# Patient Record
Sex: Female | Born: 1970 | Race: White | Hispanic: No | Marital: Married | Smoking: Former smoker
Health system: Southern US, Community
[De-identification: ages and names within clinical notes are randomized; demographics above are authoritative.]

## PROBLEM LIST (undated history)

## (undated) ENCOUNTER — Emergency Department (HOSPITAL_COMMUNITY): Admission: EM | Payer: Self-pay | Source: Home / Self Care

## (undated) DIAGNOSIS — F988 Other specified behavioral and emotional disorders with onset usually occurring in childhood and adolescence: Secondary | ICD-10-CM

## (undated) HISTORY — PX: ANKLE SURGERY: SHX546

---

## 2010-09-11 ENCOUNTER — Emergency Department (HOSPITAL_COMMUNITY)
Admission: EM | Admit: 2010-09-11 | Discharge: 2010-09-12 | Disposition: A | Discharge status: Home / Self Care | Payer: Self-pay | Attending: Emergency Medicine | Admitting: Emergency Medicine

## 2010-09-11 DIAGNOSIS — M79609 Pain in unspecified limb: Secondary | ICD-10-CM | POA: Insufficient documentation

## 2010-12-19 ENCOUNTER — Emergency Department (HOSPITAL_COMMUNITY)
Admission: EM | Admit: 2010-12-19 | Discharge: 2010-12-19 | Disposition: A | Discharge status: Home / Self Care | Payer: BC Managed Care – PPO | Attending: Emergency Medicine | Admitting: Emergency Medicine

## 2010-12-19 DIAGNOSIS — L259 Unspecified contact dermatitis, unspecified cause: Secondary | ICD-10-CM | POA: Insufficient documentation

## 2010-12-19 DIAGNOSIS — L299 Pruritus, unspecified: Secondary | ICD-10-CM | POA: Insufficient documentation

## 2010-12-19 DIAGNOSIS — M79609 Pain in unspecified limb: Secondary | ICD-10-CM | POA: Insufficient documentation

## 2011-06-06 ENCOUNTER — Encounter: Payer: Self-pay | Admitting: Emergency Medicine

## 2011-06-06 ENCOUNTER — Emergency Department (HOSPITAL_COMMUNITY)
Admission: EM | Admit: 2011-06-06 | Discharge: 2011-06-06 | Disposition: A | Discharge status: Home / Self Care | Payer: BC Managed Care – PPO | Source: Home / Self Care | Attending: Family Medicine | Admitting: Family Medicine

## 2011-06-06 DIAGNOSIS — Z888 Allergy status to other drugs, medicaments and biological substances status: Secondary | ICD-10-CM

## 2011-06-06 DIAGNOSIS — T7840XA Allergy, unspecified, initial encounter: Secondary | ICD-10-CM

## 2011-06-06 NOTE — ED Notes (Signed)
Patient reports being on cipro the last two weeks for sinus infection.  Thursday physician stopped cipro and started current medicine.  Medicine changed by physician because he felt this medicine would work better

## 2011-06-06 NOTE — ED Provider Notes (Signed)
History     CSN: [id number] Arrival date & time: 06/06/2011  1:28 PM   First MD Initiated Contact with Patient 06/06/11 1250      Chief Complaint  Patient presents with  . Sore    seen at the [facility] on wednesday, physician did drain wjound on back of neck.  reports not as much pressure, but still hurts. pat also feels she is having reaction to medicine.  in general she is itching.  onset yesterday am.      (Consider location/radiation/quality/duration/timing/severity/associated sxs/prior treatment) Patient is a 40 y.o. female presenting with abscess.  Abscess  This is a new problem. The current episode started less than one week ago. The onset was sudden. The problem has been resolved (seen by lmd on thurs and had i+d , put on bactrim, now with rash., itching.). The abscess is present on the neck. The problem is mild.    No past medical history on file.  No past surgical history on file.  No family history on file.  History  Substance Use Topics  . Smoking status: Never Smoker   . Smokeless tobacco: Not on file  . Alcohol Use: No    OB History    Grav Para Term Preterm Abortions TAB SAB Ect Mult Living                  Review of Systems  Constitutional: Negative.   HENT: Negative.   Respiratory: Negative.   Skin: Positive for rash and wound.    Allergies  Lamisil  Home Medications   Current Outpatient Rx  Name Route Sig Dispense Refill  . ESCITALOPRAM OXALATE 20 MG PO TABS Oral Take 20 mg by mouth daily.      Marland Kitchen HYDROCODONE-ACETAMINOPHEN 5-325 MG PO TABS Oral Take 1 tablet by mouth every 6 (six) hours as needed.      . TERBINAFINE HCL 250 MG PO TABS Oral Take 250 mg by mouth daily.       BP 106/65  Pulse 65  Temp(Src) 98 F (36.7 C) (Oral)  Resp 20  SpO2 97%  LMP 06/03/2011  Physical Exam  Nursing note and vitals reviewed. Constitutional: She appears well-developed and well-nourished.  HENT:  Head: Normocephalic.  Mouth/Throat:  Oropharynx is clear and moist.  Neck: Normal range of motion. Neck supple.  Pulmonary/Chest: Effort normal and breath sounds normal.  Skin: Skin is warm and dry. Rash noted. There is erythema.       ED Course  Procedures (including critical care time)  Labs Reviewed - No data to display No results found.   1. Allergic reaction to drug       MDM          Barkley Bruns, MD 06/06/11 1410

## 2011-06-20 ENCOUNTER — Emergency Department (HOSPITAL_COMMUNITY)
Admission: EM | Admit: 2011-06-20 | Discharge: 2011-06-21 | Disposition: A | Discharge status: Home / Self Care | Payer: BC Managed Care – PPO | Attending: Emergency Medicine | Admitting: Emergency Medicine

## 2011-06-20 ENCOUNTER — Encounter (HOSPITAL_COMMUNITY): Payer: Self-pay

## 2011-06-20 ENCOUNTER — Other Ambulatory Visit: Payer: Self-pay

## 2011-06-20 ENCOUNTER — Emergency Department (HOSPITAL_COMMUNITY): Payer: BC Managed Care – PPO

## 2011-06-20 DIAGNOSIS — R071 Chest pain on breathing: Secondary | ICD-10-CM | POA: Insufficient documentation

## 2011-06-20 DIAGNOSIS — Z87891 Personal history of nicotine dependence: Secondary | ICD-10-CM | POA: Insufficient documentation

## 2011-06-20 DIAGNOSIS — R0789 Other chest pain: Secondary | ICD-10-CM

## 2011-06-20 LAB — BASIC METABOLIC PANEL
CO2: 25 mEq/L (ref 19–32)
Calcium: 9.3 mg/dL (ref 8.4–10.5)
Chloride: 107 mEq/L (ref 96–112)
Potassium: 3.8 mEq/L (ref 3.5–5.1)
Sodium: 140 mEq/L (ref 135–145)

## 2011-06-20 LAB — CBC
MCH: 30.2 pg (ref 26.0–34.0)
Platelets: 265 10*3/uL (ref 150–400)
RBC: 3.98 MIL/uL (ref 3.87–5.11)
WBC: 12.2 10*3/uL — ABNORMAL HIGH (ref 4.0–10.5)

## 2011-06-20 LAB — POCT I-STAT TROPONIN I: Troponin i, poc: 0 ng/mL (ref 0.00–0.08)

## 2011-06-20 MED ORDER — KETOROLAC TROMETHAMINE 30 MG/ML IJ SOLN
30.0000 mg | Freq: Once | INTRAMUSCULAR | Status: AC
  Administered 2011-06-21: 30 mg via INTRAVENOUS
  Filled 2011-06-20: qty 1

## 2011-06-20 MED ORDER — ASPIRIN 81 MG PO CHEW
324.0000 mg | CHEWABLE_TABLET | Freq: Once | ORAL | Status: AC
  Administered 2011-06-21: 324 mg via ORAL
  Filled 2011-06-20: qty 4

## 2011-06-20 NOTE — ED Provider Notes (Addendum)
History     CSN: [id number]  Arrival date & time 06/20/11  2129   First MD Initiated Contact with Patient 06/20/11 2331      Chief Complaint  Patient presents with  . Chest Pain    (Consider location/radiation/quality/duration/timing/severity/associated sxs/prior treatment) Patient is a 40 y.o. female presenting with chest pain. The history is provided by the patient.  Chest Pain    pain has been present for 4 days and is constant. HEENT is both dull and tight. It is moderately severe and she rates the pain at 8/10. Nothing makes the pain better nothing makes it worse. It is not affected by eating, deep breathing, position, or exertion. She took aspirin 81 mg with no relief. She has not tried to do anything else for it. There is associated dyspnea which is mild. She has chronic nausea and this is unchanged. She denies diaphoresis. She denies cough or fever. She is a former 2 pack-a-day smoker having quit 2 months ago. She has no other significant cardiac risk factors.  History reviewed. No pertinent past medical history.  Past Surgical History  Procedure Date  . Cesarean section     No family history on file.  History  Substance Use Topics  . Smoking status: Former Games developer  . Smokeless tobacco: Not on file  . Alcohol Use: No    OB History    Grav Para Term Preterm Abortions TAB SAB Ect Mult Living                  Review of Systems  Cardiovascular: Positive for chest pain.  All other systems reviewed and are negative.    Allergies  Bactrim ds and Lamisil  Home Medications   Current Outpatient Rx  Name Route Sig Dispense Refill  . ESCITALOPRAM OXALATE 20 MG PO TABS Oral Take 20 mg by mouth daily.      Marland Kitchen HYDROCODONE-ACETAMINOPHEN 5-325 MG PO TABS Oral Take 1 tablet by mouth every 6 (six) hours as needed.     . TERBINAFINE HCL 250 MG PO TABS Oral Take 250 mg by mouth daily.       BP 124/76  Pulse 61  Temp(Src) 97.2 F (36.2 C) (Oral)  Resp 15  Ht 5\' 2"   (1.575 m)  Wt 170 lb (77.111 kg)  BMI 31.09 kg/m2  SpO2 96%  LMP 06/03/2011  Physical Exam  Nursing note and vitals reviewed.  40 year old female who is and in no acute distress. Vital signs are normal. Oxygen saturation is 97% which is normal. Head is normocephalic and atraumatic. PERRLA, EOMI. Oropharynx is clear. Neck is supple without adenopathy or JVD. Back is nontender. Lungs have faint bibasilar rales, no wheezes or rhonchi. Has regular rate and rhythm without murmur. There is mild bilateral parasternal tenderness which reproduces her pain. Abdomen is soft, flat, nontender without masses or hepatosplenomegaly. Extremities have no cyanosis or edema, full range of motion present. Neurologic: Mental status is normal, and cranial nerves are intact, there no focal motor or sensory deficits. Psychiatric: No abnormalities of mood or affect.  ED Course  Procedures (including critical care time)  Labs Reviewed  CBC - Abnormal; Notable for the following:    WBC 12.2 (*)    All other components within normal limits  BASIC METABOLIC PANEL  POCT I-STAT TROPONIN I  I-STAT TROPONIN I   Dg Chest 2 View  06/20/2011  *RADIOLOGY REPORT*  Clinical Data: Chest pain and tightness  CHEST - 2 VIEW  Comparison: None.  Findings: The lungs are clear without focal infiltrate, edema, pneumothorax or pleural effusion. The cardiopericardial silhouette is within normal limits for size. Imaged bony structures of the thorax are intact.  IMPRESSION: No acute findings.  Original Report Authenticated By: ERIC A. MANSELL, M.D.   Results for orders placed during the hospital encounter of 06/20/11  CBC      Component Value Range   WBC 12.2 (*) 4.0 - 10.5 (K/uL)   RBC 3.98  3.87 - 5.11 (MIL/uL)   Hemoglobin 12.0  12.0 - 15.0 (g/dL)   HCT 95.6  21.3 - 08.6 (%)   MCV 91.0  78.0 - 100.0 (fL)   MCH 30.2  26.0 - 34.0 (pg)   MCHC 33.1  30.0 - 36.0 (g/dL)   RDW 57.8  46.9 - 62.9 (%)   Platelets 265  150 - 400 (K/uL)    BASIC METABOLIC PANEL      Component Value Range   Sodium 140  135 - 145 (mEq/L)   Potassium 3.8  3.5 - 5.1 (mEq/L)   Chloride 107  96 - 112 (mEq/L)   CO2 25  19 - 32 (mEq/L)   Glucose, Bld 89  70 - 99 (mg/dL)   BUN 8  6 - 23 (mg/dL)   Creatinine, Ser 5.28  0.50 - 1.10 (mg/dL)   Calcium 9.3  8.4 - 41.3 (mg/dL)   GFR calc non Af Amer >90  >90 (mL/min)   GFR calc Af Amer >90  >90 (mL/min)  POCT I-STAT TROPONIN I      Component Value Range   Troponin i, poc 0.00  0.00 - 0.08 (ng/mL)   Comment 3           D-DIMER, QUANTITATIVE      Component Value Range   D-Dimer, Quant 0.23  0.00 - 0.48 (ug/mL-FEU)   Dg Chest 2 View  06/20/2011  *RADIOLOGY REPORT*  Clinical Data: Chest pain and tightness  CHEST - 2 VIEW  Comparison: None.  Findings: The lungs are clear without focal infiltrate, edema, pneumothorax or pleural effusion. The cardiopericardial silhouette is within normal limits for size. Imaged bony structures of the thorax are intact.  IMPRESSION: No acute findings.  Original Report Authenticated By: ERIC A. MANSELL, M.D.      No diagnosis found.  ECG shows normal sinus rhythm with a rate of 88, no ectopy. Normal axis. Normal P wave. Normal QRS. Normal intervals. Normal ST and T waves. Impression: normal ECG. No prior ECG available for comparison.  She was given aspirin and Toradol in pain is much improved.  ED workup is negative. She'll be treated as chest wall pain and sent home with a prescription for NSAIDs. MDM  Chest pain-etiology unclear. Constant pain for 4 days makes cardiac etiology unlikely, especially in light of the normal ECG. Chest wall tenderness makes musculoskeletal pain most likely.        Dione Booze, MD 06/20/11 2346  Dione Booze, MD 06/21/11 [phone]

## 2011-06-20 NOTE — ED Notes (Signed)
Pt c/o CP x3-4 days that is constant. Nothing makes the pain better or worse

## 2011-06-20 NOTE — ED Notes (Signed)
Pt states that she has been having Cp for the past three days. Pt states that she has been tired more recently and she has been taking naps which is unusual. Pt states that the pain is a dull achy feeling that is constant. Pt states that the pain goes through to her back. Pt states that when she arrived her BP was elevated which is unusual for her as well. Pt alert and oriented and able to follow commands and move extremities.

## 2011-06-21 MED ORDER — NAPROXEN 500 MG PO TABS: 500.0000 mg | ORAL_TABLET | Freq: Two times a day (BID) | ORAL | Status: AC

## 2014-12-29 ENCOUNTER — Encounter (HOSPITAL_BASED_OUTPATIENT_CLINIC_OR_DEPARTMENT_OTHER): Payer: Self-pay | Admitting: *Deleted

## 2014-12-29 ENCOUNTER — Emergency Department (HOSPITAL_BASED_OUTPATIENT_CLINIC_OR_DEPARTMENT_OTHER): Payer: BLUE CROSS/BLUE SHIELD

## 2014-12-29 ENCOUNTER — Emergency Department (HOSPITAL_BASED_OUTPATIENT_CLINIC_OR_DEPARTMENT_OTHER)
Admission: EM | Admit: 2014-12-29 | Discharge: 2014-12-30 | Disposition: A | Discharge status: Home / Self Care | Payer: BLUE CROSS/BLUE SHIELD | Attending: Emergency Medicine | Admitting: Emergency Medicine

## 2014-12-29 DIAGNOSIS — Z79899 Other long term (current) drug therapy: Secondary | ICD-10-CM | POA: Insufficient documentation

## 2014-12-29 DIAGNOSIS — R1011 Right upper quadrant pain: Secondary | ICD-10-CM | POA: Diagnosis present

## 2014-12-29 DIAGNOSIS — Z87891 Personal history of nicotine dependence: Secondary | ICD-10-CM | POA: Diagnosis not present

## 2014-12-29 DIAGNOSIS — K5900 Constipation, unspecified: Secondary | ICD-10-CM | POA: Diagnosis not present

## 2014-12-29 DIAGNOSIS — Z9889 Other specified postprocedural states: Secondary | ICD-10-CM | POA: Diagnosis not present

## 2014-12-29 DIAGNOSIS — Z3202 Encounter for pregnancy test, result negative: Secondary | ICD-10-CM | POA: Diagnosis not present

## 2014-12-29 DIAGNOSIS — R52 Pain, unspecified: Secondary | ICD-10-CM

## 2014-12-29 DIAGNOSIS — N390 Urinary tract infection, site not specified: Secondary | ICD-10-CM | POA: Insufficient documentation

## 2014-12-29 LAB — URINALYSIS, ROUTINE W REFLEX MICROSCOPIC
BILIRUBIN URINE: NEGATIVE
GLUCOSE, UA: NEGATIVE mg/dL
KETONES UR: NEGATIVE mg/dL
Leukocytes, UA: NEGATIVE
NITRITE: NEGATIVE
Protein, ur: NEGATIVE mg/dL
SPECIFIC GRAVITY, URINE: 1.012 (ref 1.005–1.030)
Urobilinogen, UA: 0.2 mg/dL (ref 0.0–1.0)
pH: 6 (ref 5.0–8.0)

## 2014-12-29 LAB — COMPREHENSIVE METABOLIC PANEL
ALT: 18 U/L (ref 14–54)
AST: 17 U/L (ref 15–41)
Albumin: 4 g/dL (ref 3.5–5.0)
Alkaline Phosphatase: 71 U/L (ref 38–126)
Anion gap: 11 (ref 5–15)
BILIRUBIN TOTAL: 0.5 mg/dL (ref 0.3–1.2)
BUN: 9 mg/dL (ref 6–20)
CHLORIDE: 99 mmol/L — AB (ref 101–111)
CO2: 25 mmol/L (ref 22–32)
Calcium: 9.1 mg/dL (ref 8.9–10.3)
Creatinine, Ser: 0.73 mg/dL (ref 0.44–1.00)
GLUCOSE: 107 mg/dL — AB (ref 65–99)
POTASSIUM: 3.7 mmol/L (ref 3.5–5.1)
Sodium: 135 mmol/L (ref 135–145)
TOTAL PROTEIN: 7.4 g/dL (ref 6.5–8.1)

## 2014-12-29 LAB — URINE MICROSCOPIC-ADD ON

## 2014-12-29 LAB — CBC WITH DIFFERENTIAL/PLATELET
BASOS ABS: 0.1 10*3/uL (ref 0.0–0.1)
BASOS PCT: 0 % (ref 0–1)
EOS PCT: 3 % (ref 0–5)
Eosinophils Absolute: 0.5 10*3/uL (ref 0.0–0.7)
HEMATOCRIT: 39.4 % (ref 36.0–46.0)
Hemoglobin: 13.3 g/dL (ref 12.0–15.0)
LYMPHS ABS: 3.5 10*3/uL (ref 0.7–4.0)
Lymphocytes Relative: 27 % (ref 12–46)
MCH: 30.3 pg (ref 26.0–34.0)
MCHC: 33.8 g/dL (ref 30.0–36.0)
MCV: 89.7 fL (ref 78.0–100.0)
Monocytes Absolute: 1.1 10*3/uL — ABNORMAL HIGH (ref 0.1–1.0)
Monocytes Relative: 9 % (ref 3–12)
Neutro Abs: 8 10*3/uL — ABNORMAL HIGH (ref 1.7–7.7)
Neutrophils Relative %: 61 % (ref 43–77)
Platelets: 274 10*3/uL (ref 150–400)
RBC: 4.39 MIL/uL (ref 3.87–5.11)
RDW: 13.7 % (ref 11.5–15.5)
WBC: 13.1 10*3/uL — ABNORMAL HIGH (ref 4.0–10.5)

## 2014-12-29 LAB — PREGNANCY, URINE: PREG TEST UR: NEGATIVE

## 2014-12-29 MED ORDER — DICYCLOMINE HCL 10 MG/ML IM SOLN
20.0000 mg | Freq: Once | INTRAMUSCULAR | Status: AC
  Administered 2014-12-29: 20 mg via INTRAMUSCULAR
  Filled 2014-12-29: qty 2

## 2014-12-29 MED ORDER — GI COCKTAIL ~~LOC~~
30.0000 mL | Freq: Once | ORAL | Status: AC
  Administered 2014-12-29: 30 mL via ORAL
  Filled 2014-12-29: qty 30

## 2014-12-29 MED ORDER — KETOROLAC TROMETHAMINE 30 MG/ML IJ SOLN
30.0000 mg | Freq: Once | INTRAMUSCULAR | Status: AC
  Administered 2014-12-29: 30 mg via INTRAVENOUS
  Filled 2014-12-29: qty 1

## 2014-12-29 NOTE — ED Notes (Signed)
Pt states URQ pain off and on for past few months, was seen at [facility] about a month ago and sent home with no radiology studies done.   Discharged to home.  Pt states today the pain is much worse and is constant.

## 2014-12-29 NOTE — ED Provider Notes (Signed)
CSN: [id number]     Arrival date & time 12/29/14  2204 History   This chart was scribed for Jyquan Kenley, MD by Octavia Heir, ED Scribe. This patient was seen in room MH08/MH08 and the patient's care was started at 11:06 PM.    Chief Complaint  Patient presents with  . Abdominal Pain     Patient is a 44 y.o. female presenting with abdominal pain. The history is provided by the patient. No language interpreter was used.  Abdominal Pain Pain location:  RUQ and RLQ Pain quality: stabbing   Pain radiates to:  Does not radiate Pain severity:  Severe Onset quality:  Gradual Timing:  Intermittent Progression:  Unchanged Chronicity:  New Context: not alcohol use   Relieved by:  Nothing Worsened by:  Nothing tried Ineffective treatments:  None tried Associated symptoms: no dysuria   Risk factors: no alcohol abuse    HPI Comments: Mary Silva is a 44 y.o. female who presents to the Emergency Department complaining of intermittent, gradual worsening upper right quadrant pain onset about one month ago. Pt notes it has been constant for the past 4 hours. Pt was seen at [facility] and was given medication, but pain was consistent. She notes it is a sharp pain that worsens when bends over and then stands back up. She notes her bowel movements are normal. Pt denies burning while urination and urinary frequency.  History reviewed. No pertinent past medical history. Past Surgical History  Procedure Laterality Date  . Cesarean section     No family history on file. History  Substance Use Topics  . Smoking status: Former Games developer  . Smokeless tobacco: Not on file  . Alcohol Use: No   OB History    No data available     Review of Systems  Gastrointestinal: Positive for abdominal pain.  Genitourinary: Negative for dysuria and urgency.  All other systems reviewed and are negative.   A complete 10 system review of systems was obtained and all systems are negative except as noted in the HPI and  PMH.    Allergies  Bactrim ds and Terbinafine hcl  Home Medications   Prior to Admission medications   Medication Sig Start Date End Date Taking? Authorizing Provider  varenicline (CHANTIX PAK) 0.5 MG X 11 & 1 MG X 42 tablet Take by mouth 2 (two) times daily. Take one 0.5 mg tablet by mouth once daily for 3 days, then increase to one 0.5 mg tablet twice daily for 4 days, then increase to one 1 mg tablet twice daily.   Yes Historical Provider, MD  escitalopram (LEXAPRO) 20 MG tablet Take 20 mg by mouth daily.      Historical Provider, MD  HYDROcodone-acetaminophen (NORCO) 5-325 MG per tablet Take 1 tablet by mouth every 6 (six) hours as needed.     Historical Provider, MD  terbinafine (LAMISIL) 250 MG tablet Take 250 mg by mouth daily.     Historical Provider, MD   Triage vitals: BP 124/92 mmHg  Pulse 82  Temp(Src) 98.3 F (36.8 C) (Oral)  Resp 18  Ht 5\' 2"  (1.575 m)  Wt 175 lb (79.379 kg)  BMI 32.00 kg/m2  SpO2 96%  LMP 12/10/2014 Physical Exam  Constitutional: She is oriented to person, place, and time. She appears well-developed and well-nourished. No distress.  HENT:  Head: Normocephalic.  Mouth/Throat: Oropharynx is clear and moist. No oropharyngeal exudate.  Eyes: Conjunctivae are normal. Pupils are equal, round, and reactive to light. No scleral  icterus.  Neck: Normal range of motion. Neck supple. No thyromegaly present.  Cardiovascular: Normal rate and regular rhythm.  Exam reveals no gallop and no friction rub.   No murmur heard. Pulmonary/Chest: Effort normal and breath sounds normal. No respiratory distress. She has no wheezes. She has no rales.  Abdominal: Soft. She exhibits no distension and no mass. Bowel sounds are increased. There is no tenderness. There is no rebound, no guarding, no tenderness at McBurney's point and negative Murphy's sign.  Hyperactive bowel sound throughout Minor constipation  Musculoskeletal: Normal range of motion.  Neurological: She is  alert and oriented to person, place, and time.  Skin: Skin is warm and dry. No rash noted.  Psychiatric: She has a normal mood and affect. Her behavior is normal.  Nursing note and vitals reviewed.   ED Course  Procedures  DIAGNOSTIC STUDIES: Oxygen Saturation is 96% on RA, normal by my interpretation.  COORDINATION OF CARE:  11:09 PM Discussed treatment plan which includes urinalysis with pt at bedside and pt agreed to plan.  Labs Review Labs Reviewed  CBC WITH DIFFERENTIAL/PLATELET  COMPREHENSIVE METABOLIC PANEL    Imaging Review No results found.   EKG Interpretation None      MDM   Final diagnoses:  None    Medications  nitrofurantoin (macrocrystal-monohydrate) (MACROBID) capsule 100 mg (not administered)  ketorolac (TORADOL) 30 MG/ML injection 30 mg (30 mg Intravenous Given 12/29/14 2325)  dicyclomine (BENTYL) injection 20 mg (20 mg Intramuscular Given 12/29/14 2325)  gi cocktail (Maalox,Lidocaine,Donnatal) (30 mLs Oral Given 12/29/14 2325)   Results for orders placed or performed during the hospital encounter of 12/29/14  CBC with Differential  Result Value Ref Range   WBC 13.1 (H) 4.0 - 10.5 K/uL   RBC 4.39 3.87 - 5.11 MIL/uL   Hemoglobin 13.3 12.0 - 15.0 g/dL   HCT 16.139.4 09.636.0 - 04.546.0 %   MCV 89.7 78.0 - 100.0 fL   MCH 30.3 26.0 - 34.0 pg   MCHC 33.8 30.0 - 36.0 g/dL   RDW 40.913.7 81.111.5 - 91.415.5 %   Platelets 274 150 - 400 K/uL   Neutrophils Relative % 61 43 - 77 %   Neutro Abs 8.0 (H) 1.7 - 7.7 K/uL   Lymphocytes Relative 27 12 - 46 %   Lymphs Abs 3.5 0.7 - 4.0 K/uL   Monocytes Relative 9 3 - 12 %   Monocytes Absolute 1.1 (H) 0.1 - 1.0 K/uL   Eosinophils Relative 3 0 - 5 %   Eosinophils Absolute 0.5 0.0 - 0.7 K/uL   Basophils Relative 0 0 - 1 %   Basophils Absolute 0.1 0.0 - 0.1 K/uL  Comprehensive metabolic panel  Result Value Ref Range   Sodium 135 135 - 145 mmol/L   Potassium 3.7 3.5 - 5.1 mmol/L   Chloride 99 (L) 101 - 111 mmol/L   CO2 25 22 - 32  mmol/L   Glucose, Bld 107 (H) 65 - 99 mg/dL   BUN 9 6 - 20 mg/dL   Creatinine, Ser 7.820.73 0.44 - 1.00 mg/dL   Calcium 9.1 8.9 - 95.610.3 mg/dL   Total Protein 7.4 6.5 - 8.1 g/dL   Albumin 4.0 3.5 - 5.0 g/dL   AST 17 15 - 41 U/L   ALT 18 14 - 54 U/L   Alkaline Phosphatase 71 38 - 126 U/L   Total Bilirubin 0.5 0.3 - 1.2 mg/dL   GFR calc non Af Amer >60 >60 mL/min   GFR calc Af  Amer >60 >60 mL/min   Anion gap 11 5 - 15  Urinalysis, Routine w reflex microscopic (not at [facility])  Result Value Ref Range   Color, Urine YELLOW YELLOW   APPearance CLOUDY (A) CLEAR   Specific Gravity, Urine 1.012 1.005 - 1.030   pH 6.0 5.0 - 8.0   Glucose, UA NEGATIVE NEGATIVE mg/dL   Hgb urine dipstick TRACE (A) NEGATIVE   Bilirubin Urine NEGATIVE NEGATIVE   Ketones, ur NEGATIVE NEGATIVE mg/dL   Protein, ur NEGATIVE NEGATIVE mg/dL   Urobilinogen, UA 0.2 0.0 - 1.0 mg/dL   Nitrite NEGATIVE NEGATIVE   Leukocytes, UA NEGATIVE NEGATIVE  Pregnancy, urine  Result Value Ref Range   Preg Test, Ur NEGATIVE NEGATIVE  Urine microscopic-add on  Result Value Ref Range   Squamous Epithelial / LPF FEW (A) RARE   WBC, UA 0-2 <3 WBC/hpf   RBC / HPF 3-6 <3 RBC/hpf   Bacteria, UA MANY (A) RARE   Ct Renal Stone Study  12/30/2014   CLINICAL DATA:  Intermittent worsening right upper quadrant abdominal pain over the past month. Hematuria. Initial encounter.  EXAM: CT ABDOMEN AND PELVIS WITHOUT CONTRAST  TECHNIQUE: Multidetector CT imaging of the abdomen and pelvis was performed following the standard protocol without IV contrast.  COMPARISON:  CT of the abdomen and pelvis from 08/21/2014  FINDINGS: The visualized lung bases are clear.  There is diffuse fatty infiltration within the liver, with mild sparing about the gallbladder fossa. The gallbladder is within normal limits. The pancreas and adrenal glands are unremarkable.  The kidneys are unremarkable in appearance. There is no evidence of hydronephrosis. No renal or ureteral stones  are seen. No perinephric stranding is appreciated.  No free fluid is identified. The small bowel is unremarkable in appearance. The stomach is within normal limits. No acute vascular abnormalities are seen. Minimal calcification is noted at the aortic bifurcation.  The appendix is normal in caliber, without evidence for appendicitis. The colon is unremarkable in appearance.  The bladder is decompressed and not well assessed. The uterus is grossly unremarkable. The ovaries are grossly symmetric. No suspicious adnexal masses are seen. No inguinal lymphadenopathy is seen.  No acute osseous abnormalities are identified.  IMPRESSION: 1. No acute abnormality seen within the abdomen or pelvis. 2. Diffuse fatty infiltration within the liver.   Electronically Signed   By: Roanna Raider M.D.   On: 12/30/2014 01:22    currently pain free   Patient treated for UTI.  Symptoms not consistent with UTI.  Likely cramping.  Had a recent normal US of the gall bladder at [facility].  No stones.  No kidney stones.  Will treat for spasm.  And UTI  I personally performed the services described in this documentation, which was scribed in my presence. The recorded information has been reviewed and is accurate.    Cy Blamer, MD 12/30/14 [phone]

## 2014-12-29 NOTE — ED Notes (Signed)
Pt reports onset of right sided abdominal pain since about 1630 today, associated with nausea. No meds prior to arrival.

## 2014-12-30 ENCOUNTER — Encounter (HOSPITAL_BASED_OUTPATIENT_CLINIC_OR_DEPARTMENT_OTHER): Payer: Self-pay | Admitting: Emergency Medicine

## 2014-12-30 MED ORDER — NITROFURANTOIN MONOHYD MACRO 100 MG PO CAPS
100.0000 mg | ORAL_CAPSULE | Freq: Once | ORAL | Status: AC
  Administered 2014-12-30: 100 mg via ORAL
  Filled 2014-12-30: qty 1

## 2014-12-30 MED ORDER — NITROFURANTOIN MONOHYD MACRO 100 MG PO CAPS: 100.0000 mg | ORAL_CAPSULE | Freq: Two times a day (BID) | ORAL | Status: DC

## 2014-12-30 MED ORDER — HYOSCYAMINE SULFATE 0.125 MG SL SUBL: 0.1250 mg | SUBLINGUAL_TABLET | SUBLINGUAL | Status: DC | PRN

## 2015-05-05 ENCOUNTER — Emergency Department (HOSPITAL_BASED_OUTPATIENT_CLINIC_OR_DEPARTMENT_OTHER): Payer: BLUE CROSS/BLUE SHIELD

## 2015-05-05 ENCOUNTER — Encounter (HOSPITAL_BASED_OUTPATIENT_CLINIC_OR_DEPARTMENT_OTHER): Payer: Self-pay | Admitting: Emergency Medicine

## 2015-05-05 ENCOUNTER — Emergency Department (HOSPITAL_BASED_OUTPATIENT_CLINIC_OR_DEPARTMENT_OTHER)
Admission: EM | Admit: 2015-05-05 | Discharge: 2015-05-05 | Disposition: A | Discharge status: Home / Self Care | Payer: BLUE CROSS/BLUE SHIELD | Attending: Emergency Medicine | Admitting: Emergency Medicine

## 2015-05-05 DIAGNOSIS — Z87891 Personal history of nicotine dependence: Secondary | ICD-10-CM | POA: Insufficient documentation

## 2015-05-05 DIAGNOSIS — Z79899 Other long term (current) drug therapy: Secondary | ICD-10-CM | POA: Insufficient documentation

## 2015-05-05 DIAGNOSIS — M25571 Pain in right ankle and joints of right foot: Secondary | ICD-10-CM | POA: Insufficient documentation

## 2015-05-05 DIAGNOSIS — F909 Attention-deficit hyperactivity disorder, unspecified type: Secondary | ICD-10-CM | POA: Diagnosis not present

## 2015-05-05 HISTORY — DX: Other specified behavioral and emotional disorders with onset usually occurring in childhood and adolescence: F98.8

## 2015-05-05 MED ORDER — TRAMADOL HCL 50 MG PO TABS: 50.0000 mg | ORAL_TABLET | Freq: Four times a day (QID) | ORAL | Status: DC | PRN

## 2015-05-05 MED ORDER — OXYCODONE-ACETAMINOPHEN 5-325 MG PO TABS
1.0000 | ORAL_TABLET | Freq: Once | ORAL | Status: AC
  Administered 2015-05-05: 1 via ORAL
  Filled 2015-05-05: qty 1
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
## 2015-05-05 NOTE — Discharge Instructions (Signed)
Take the prescribed medication as directed.  May supplement this with tylenol/motrin if desired. Follow-up with orthopedics-- call to make appt with physician on call, Dr. Roda ShuttersXu or you may find an orthopedist of your choosing. Return to the ED for new or worsening symptoms.
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
## 2015-07-20 DIAGNOSIS — M722 Plantar fascial fibromatosis: Secondary | ICD-10-CM | POA: Insufficient documentation

## 2015-07-20 DIAGNOSIS — E7800 Pure hypercholesterolemia, unspecified: Secondary | ICD-10-CM | POA: Insufficient documentation

## 2015-07-20 DIAGNOSIS — F909 Attention-deficit hyperactivity disorder, unspecified type: Secondary | ICD-10-CM | POA: Insufficient documentation

## 2015-07-20 DIAGNOSIS — J449 Chronic obstructive pulmonary disease, unspecified: Secondary | ICD-10-CM | POA: Insufficient documentation

## 2015-07-20 DIAGNOSIS — H903 Sensorineural hearing loss, bilateral: Secondary | ICD-10-CM | POA: Insufficient documentation

## 2015-07-26 DIAGNOSIS — M19171 Post-traumatic osteoarthritis, right ankle and foot: Secondary | ICD-10-CM | POA: Insufficient documentation

## 2015-09-28 ENCOUNTER — Encounter (HOSPITAL_BASED_OUTPATIENT_CLINIC_OR_DEPARTMENT_OTHER): Payer: Self-pay | Admitting: *Deleted

## 2015-09-28 ENCOUNTER — Emergency Department (HOSPITAL_BASED_OUTPATIENT_CLINIC_OR_DEPARTMENT_OTHER)
Admission: EM | Admit: 2015-09-28 | Discharge: 2015-09-28 | Disposition: A | Discharge status: Home / Self Care | Payer: BLUE CROSS/BLUE SHIELD | Attending: Emergency Medicine | Admitting: Emergency Medicine

## 2015-09-28 DIAGNOSIS — F909 Attention-deficit hyperactivity disorder, unspecified type: Secondary | ICD-10-CM | POA: Insufficient documentation

## 2015-09-28 DIAGNOSIS — Z87891 Personal history of nicotine dependence: Secondary | ICD-10-CM | POA: Diagnosis not present

## 2015-09-28 DIAGNOSIS — Z4801 Encounter for change or removal of surgical wound dressing: Secondary | ICD-10-CM | POA: Insufficient documentation

## 2015-09-28 DIAGNOSIS — Z79899 Other long term (current) drug therapy: Secondary | ICD-10-CM | POA: Insufficient documentation

## 2015-09-28 DIAGNOSIS — Z5189 Encounter for other specified aftercare: Secondary | ICD-10-CM

## 2015-09-28 MED ORDER — IBUPROFEN 800 MG PO TABS: 800.0000 mg | ORAL_TABLET | Freq: Three times a day (TID) | ORAL | Status: DC

## 2015-09-28 MED ORDER — IBUPROFEN 800 MG PO TABS
800.0000 mg | ORAL_TABLET | Freq: Once | ORAL | Status: AC
  Administered 2015-09-28: 800 mg via ORAL
  Filled 2015-09-28: qty 1

## 2015-09-28 NOTE — Discharge Instructions (Signed)
Use OTC pain relievers such as tylenol or motrin for pain. Applying ice to the area may aid in pain relief. Use a pillow to prop yourself on your left side when you sleep so you don't aggravate it while asleep. Follow up with your plastic surgeon as needed.   Sutured Wound Care Sutures are stitches that can be used to close wounds. Taking care of your wound properly can help to prevent pain and infection. It can also help your wound to heal more quickly. HOW TO CARE FOR YOUR SUTURED WOUND Wound Care  Keep the wound clean and dry.  If you were given a bandage (dressing), you should change it at least once per day or as directed by your health care provider. You should also change it if it becomes wet or dirty.  Keep the wound completely dry for the first 24 hours or as directed by your health care provider. After that time, you may shower or bathe. However, make sure that the wound is not soaked in water until the sutures have been removed.  Clean the wound one time each day or as directed by your health care provider.  Wash the wound with soap and water.  Rinse the wound with water to remove all soap.  Pat the wound dry with a clean towel. Do not rub the wound.  Aftercleaning the wound, apply a thin layer of antibioticointment as directed by your health care provider. This will help to prevent infection and keep the dressing from sticking to the wound.  Have the sutures removed as directed by your health care provider. General Instructions  Take or apply medicines only as directed by your health care provider.  To help prevent scarring, make sure to cover your wound with sunscreen whenever you are outside after the sutures are removed and the wound is healed. Make sure to wear a sunscreen of at least 30 SPF.  If you were prescribed an antibiotic medicine or ointment, finish all of it even if you start to feel better.  Do not scratch or pick at the wound.  Keep all follow-up visits  as directed by your health care provider. This is important.  Check your wound every day for signs of infection. Watch for:   Redness, swelling, or pain.  Fluid, blood, or pus.  Raise (elevate) the injured area above the level of your heart while you are sitting or lying down, if possible.  Avoid stretching your wound.  Drink enough fluids to keep your urine clear or pale yellow. SEEK MEDICAL CARE IF:  You received a tetanus shot and you have swelling, severe pain, redness, or bleeding at the injection site.  You have a fever.  A wound that was closed breaks open.  You notice a bad smell coming from the wound.  You notice something coming out of the wound, such as wood or glass.  Your pain is not controlled with medicine.  You have increased redness, swelling, or pain at the site of your wound.  You have fluid, blood, or pus coming from your wound.  You notice a change in the color of your skin near your wound.  You need to change the dressing frequently due to fluid, blood, or pus draining from the wound.  You develop a new rash.  You develop numbness around the wound. SEEK IMMEDIATE MEDICAL CARE IF:  You develop severe swelling around the injury site.  Your pain suddenly increases and is severe.  You develop painful lumps  near the wound or on skin that is anywhere on your body.  You have a red streak going away from your wound.  The wound is on your hand or foot and you cannot properly move a finger or toe.  The wound is on your hand or foot and you notice that your fingers or toes look pale or bluish.   This information is not intended to replace advice given to you by your health care provider. Make sure you discuss any questions you have with your health care provider.   Document Released: 07/23/2004 Document Revised: 10/30/2014 Document Reviewed: 01/25/2013 Elsevier Interactive Patient Education [employer]

## 2015-09-28 NOTE — ED Provider Notes (Signed)
CSN: [id number]     Arrival date & time 09/28/15  1722 History   First MD Initiated Contact with Patient 09/28/15 1825     Chief Complaint  Patient presents with  . Wound Check   HPI  Mary Silva is a 45 year old female presenting with suture pain. Patient had a cyst removed from her right earlobe 2 days ago. She states that the suture site is painful and has been using blood. She states that it is difficult to not aggravate the ear she rolls over in bed and plays with her hair. She denies purulent drainage from the wound. She has redness of the skin around her ear. She denies systemic symptoms including fever, chills, dizziness, nausea or vomiting. She has tried Tylenol at home for the pain without relief. She has not tried any other conservative measures. She has no other complaints today.  Past Medical History  Diagnosis Date  . ADD (attention deficit disorder)    Past Surgical History  Procedure Laterality Date  . Cesarean section    . Ankle surgery     No family history on file. Social History  Substance Use Topics  . Smoking status: Former Games developermoker  . Smokeless tobacco: Never Used  . Alcohol Use: No   OB History    No data available     Review of Systems  Skin: Positive for wound.  All other systems reviewed and are negative.     Allergies  Bactrim ds and Terbinafine hcl  Home Medications   Prior to Admission medications   Medication Sig Start Date End Date Taking? Authorizing Provider  amphetamine-dextroamphetamine (ADDERALL) 20 MG tablet Take 20 mg by mouth daily.   Yes Historical Provider, MD  omeprazole (PRILOSEC) 10 MG capsule Take 10 mg by mouth daily.   Yes Historical Provider, MD  amphetamine-dextroamphetamine (ADDERALL XR) 10 MG 24 hr capsule Take 10 mg by mouth daily.    Historical Provider, MD  ibuprofen (ADVIL,MOTRIN) 800 MG tablet Take 1 tablet (800 mg total) by mouth 3 (three) times daily. 09/28/15   Cella Cappello, PA-C  traMADol (ULTRAM) 50 MG tablet Take  1 tablet (50 mg total) by mouth every 6 (six) hours as needed. 05/05/15   Garlon HatchetLisa M Sanders, PA-C  varenicline (CHANTIX PAK) 0.5 MG X 11 & 1 MG X 42 tablet Take by mouth 2 (two) times daily. Take one 0.5 mg tablet by mouth once daily for 3 days, then increase to one 0.5 mg tablet twice daily for 4 days, then increase to one 1 mg tablet twice daily.    Historical Provider, MD   BP 133/93 mmHg  Pulse 90  Temp(Src) 98.7 F (37.1 C) (Oral)  Resp 18  Ht 5\' 2"  (1.575 m)  Wt 88.451 kg  BMI 35.66 kg/m2  SpO2 96%  LMP 08/28/2015 Physical Exam  Constitutional: She appears well-developed and well-nourished. No distress.  Nontoxic-appearing  HENT:  Head: Normocephalic and atraumatic.  Right Ear: External ear normal.  Left Ear: External ear normal.  Ears:  Well-healing neurosurgical wound anterior and posterior ear lobule. 2 sutures in place anteriorly and 5 sutures in place posteriorly. No purulent drainage. No active bleeding at this time. Small amount of crusted blood noted over the anterior wound. No dehiscence. No erythema of the surrounding skin. Right ear canal without swelling or erythema.  Eyes: Conjunctivae are normal. Right eye exhibits no discharge. Left eye exhibits no discharge. No scleral icterus.  Neck: Normal range of motion.  Cardiovascular: Normal rate.  Pulmonary/Chest: Effort normal.  Musculoskeletal: Normal range of motion.  Moves all extremities spontaneously  Neurological: She is alert. Coordination normal.  Skin: Skin is warm and dry.  Psychiatric: She has a normal mood and affect. Her behavior is normal.  Nursing note and vitals reviewed.   ED Course  Procedures (including critical care time) Labs Review Labs Reviewed - No data to display  Imaging Review No results found. I have personally reviewed and evaluated these images and lab results as part of my medical decision-making.   EKG Interpretation None      MDM   Final diagnoses:  Visit for wound check     45 year old female presenting for a wound check today. Had cyst removed from right earlobe 2 days ago and reports pain at the suture site. She reports inadvertently aggravating the ear when sleeping or brushing her hair. Tylenol used at home with no relief. Afebrile and nontoxic appearing. Well-healing surgical wound noted to right anterior and posterior ear lobe. No purulence or erythema of the shadowing skin. No signs of infection. Discussed other measures of pain control including NSAIDs and ice. Suggested patient keep her hair pulled in a ponytail until the wound heals. Patient is to follow up with her plastic surgeon as needed. Return precautions given in discharge paperwork and discussed with Mary Silva at bedside. Mary Silva stable for discharge    Alveta Heimlich, PA-C 09/28/15 1917  Alvira Monday, MD 09/30/15 1530

## 2015-09-28 NOTE — ED Notes (Signed)
Cyst removed from right ear on Thursday. Pt reports sutures are painful

## 2015-12-23 ENCOUNTER — Emergency Department (HOSPITAL_COMMUNITY): Payer: Self-pay

## 2015-12-23 ENCOUNTER — Encounter (HOSPITAL_COMMUNITY): Payer: Self-pay

## 2015-12-23 ENCOUNTER — Other Ambulatory Visit: Payer: Self-pay

## 2015-12-23 ENCOUNTER — Emergency Department (HOSPITAL_COMMUNITY)
Admission: EM | Admit: 2015-12-23 | Discharge: 2015-12-23 | Disposition: A | Discharge status: Home / Self Care | Payer: Self-pay | Attending: Emergency Medicine | Admitting: Emergency Medicine

## 2015-12-23 DIAGNOSIS — R0789 Other chest pain: Secondary | ICD-10-CM | POA: Insufficient documentation

## 2015-12-23 DIAGNOSIS — Z79899 Other long term (current) drug therapy: Secondary | ICD-10-CM | POA: Insufficient documentation

## 2015-12-23 DIAGNOSIS — R079 Chest pain, unspecified: Secondary | ICD-10-CM

## 2015-12-23 LAB — I-STAT TROPONIN, ED
TROPONIN I, POC: 0 ng/mL (ref 0.00–0.08)
Troponin i, poc: 0 ng/mL (ref 0.00–0.08)

## 2015-12-23 LAB — BASIC METABOLIC PANEL
Anion gap: 6 (ref 5–15)
BUN: 6 mg/dL (ref 6–20)
CALCIUM: 8.6 mg/dL — AB (ref 8.9–10.3)
CHLORIDE: 107 mmol/L (ref 101–111)
CO2: 23 mmol/L (ref 22–32)
CREATININE: 0.78 mg/dL (ref 0.44–1.00)
GFR calc non Af Amer: >60 mL/min (ref >60)
GLUCOSE: 100 mg/dL — AB (ref 65–99)
Potassium: 3.6 mmol/L (ref 3.5–5.1)
Sodium: 136 mmol/L (ref 135–145)

## 2015-12-23 LAB — CBC
HCT: 40 % (ref 36.0–46.0)
Hemoglobin: 13 g/dL (ref 12.0–15.0)
MCH: 29.6 pg (ref 26.0–34.0)
MCHC: 32.5 g/dL (ref 30.0–36.0)
MCV: 91.1 fL (ref 78.0–100.0)
PLATELETS: 284 10*3/uL (ref 150–400)
RBC: 4.39 MIL/uL (ref 3.87–5.11)
RDW: 13.1 % (ref 11.5–15.5)
WBC: 9.4 10*3/uL (ref 4.0–10.5)

## 2015-12-23 MED ORDER — NAPROXEN 500 MG PO TABS: 500.0000 mg | ORAL_TABLET | Freq: Two times a day (BID) | ORAL | Status: AC

## 2015-12-23 MED ORDER — IOPAMIDOL (ISOVUE-370) INJECTION 76%
INTRAVENOUS | Status: AC
  Administered 2015-12-23: 100 mL
  Filled 2015-12-23: qty 100

## 2015-12-23 MED ORDER — FENTANYL CITRATE (PF) 100 MCG/2ML IJ SOLN
100.0000 ug | Freq: Once | INTRAMUSCULAR | Status: AC
  Administered 2015-12-23: 100 ug via INTRAVENOUS
  Filled 2015-12-23: qty 2

## 2015-12-23 MED ORDER — SODIUM CHLORIDE 0.9 % IV BOLUS (SEPSIS)
1000.0000 mL | Freq: Once | INTRAVENOUS | Status: AC
  Administered 2015-12-23: 1000 mL via INTRAVENOUS

## 2015-12-23 NOTE — ED Notes (Signed)
Pt arrives EMS with c/o chest pain x 1 hour described as tearing and radiating into back between shoulder blades. Given 324 mg Aspiring and nitro x 2 taking pain from 10/10 to 5/10

## 2015-12-23 NOTE — ED Provider Notes (Signed)
CSN: [id number]     Arrival date & time 12/23/15  1716 History   First MD Initiated Contact with Patient 12/23/15 1717     Chief Complaint  Patient presents with  . Chest Pain     (Consider location/radiation/quality/duration/timing/severity/associated sxs/prior Treatment) HPI Mary BoatmanRebecca Silva is a 45 y.o. female who presents for evaluation of chest pain. Patient reports an approximate 4:00 PM, she experienced a sudden onset chest discomfort that felt like "something crawling through the front of my chest into my back". She reports EMS gave her aspirin and sublingual nitroglycerin 2 which seemed to relieve her discomfort. She denies shortness of breath, nausea or vomiting, diaphoresis. No fevers, chills, cough or hemoptysis, leg swelling, numbness or weakness, recent travel or surgeries, OCP use. She is a 40-pack-year former smoker. She does report a family history of cardiac disease. No other modifying factors.  Past Medical History  Diagnosis Date  . ADD (attention deficit disorder)    Past Surgical History  Procedure Laterality Date  . Cesarean section    . Ankle surgery     Family History  Problem Relation Age of Onset  . Heart failure Mother   . Heart attack Mother   . Hypertension Father   . Heart attack Father   . Hypertension Sister   . Heart failure Brother    Social History  Substance Use Topics  . Smoking status: Former Games developermoker  . Smokeless tobacco: Never Used  . Alcohol Use: No   OB History    No data available     Review of Systems A 10 point review of systems was completed and was negative except for pertinent positives and negatives as mentioned in the history of present illness     Allergies  Bactrim ds and Terbinafine hcl  Home Medications   Prior to Admission medications   Medication Sig Start Date End Date Taking? Authorizing Provider  amphetamine-dextroamphetamine (ADDERALL XR) 10 MG 24 hr capsule Take 10 mg by mouth daily.    Historical Provider,  MD  amphetamine-dextroamphetamine (ADDERALL) 20 MG tablet Take 20 mg by mouth daily.    Historical Provider, MD  ibuprofen (ADVIL,MOTRIN) 800 MG tablet Take 1 tablet (800 mg total) by mouth 3 (three) times daily. 09/28/15   Stevi Barrett, PA-C  omeprazole (PRILOSEC) 10 MG capsule Take 10 mg by mouth daily.    Historical Provider, MD  traMADol (ULTRAM) 50 MG tablet Take 1 tablet (50 mg total) by mouth every 6 (six) hours as needed. 05/05/15   Garlon HatchetLisa M Sanders, PA-C  varenicline (CHANTIX PAK) 0.5 MG X 11 & 1 MG X 42 tablet Take by mouth 2 (two) times daily. Take one 0.5 mg tablet by mouth once daily for 3 days, then increase to one 0.5 mg tablet twice daily for 4 days, then increase to one 1 mg tablet twice daily.    Historical Provider, MD   Pulse 88  Temp(Src) 98.1 F (36.7 C) (Oral)  Resp 18  Ht 5\' 2"  (1.575 m)  Wt 87.998 kg  BMI 35.47 kg/m2  SpO2 97%  LMP 12/20/2015 Physical Exam  Constitutional: She is oriented to person, place, and time. She appears well-developed and well-nourished.  HENT:  Head: Normocephalic and atraumatic.  Mouth/Throat: Oropharynx is clear and moist.  Eyes: Conjunctivae are normal. Pupils are equal, round, and reactive to light. Right eye exhibits no discharge. Left eye exhibits no discharge. No scleral icterus.  Neck: Neck supple.  Cardiovascular: Normal rate, regular rhythm, normal heart sounds and  intact distal pulses.   Pulmonary/Chest: Effort normal and breath sounds normal. No respiratory distress. She has no wheezes. She has no rales.  Abdominal: Soft. She exhibits no distension and no mass. There is no tenderness. There is no rebound and no guarding.  Musculoskeletal: She exhibits no edema or tenderness.  Neurological: She is alert and oriented to person, place, and time.  Cranial Nerves II-XII grossly intact  Skin: Skin is warm and dry. No rash noted.  Psychiatric: She has a normal mood and affect.  Nursing note and vitals reviewed.   ED Course   Procedures (including critical care time) Labs Review Labs Reviewed  BASIC METABOLIC PANEL  CBC  I-STAT TROPOININ, ED    Imaging Review No results found. I have personally reviewed and evaluated these images and lab results as part of my medical decision-making.   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 Nonexertional, no other symptoms. Heart score 2. Plan to rule out dissection with CTA. Plan for delta troponin. Screening labs are unremarkable as his initial troponin. EKG is reassuring. Chest x-ray is unremarkable. Patient care signed out to Dr. Criss AlvineGoldston for follow-up on imaging, delta troponin, subsequent reevaluation and disposition. Patient overall appears well and is appropriate for signout. Final diagnoses:  Nonspecific chest pain        Joycie PeekBenjamin Harlene Petralia, PA-C 12/24/15 1526  Pricilla LovelessScott Goldston, MD 01/03/16 [phone]

## 2015-12-23 NOTE — ED Notes (Signed)
Patient transported to X-ray 

## 2015-12-24 NOTE — ED Provider Notes (Signed)
Medical screening examination/treatment/procedure(s) were conducted as a shared visit with non-physician practitioner(s) and myself.  I personally evaluated the patient during the encounter.   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 Initial hypertensive at her work, 160/100. Now normotensive. R/o dissection with negative CTA. 2 negative troponins. ECG unremarkable. D/c home. Likely MSK. Discussed return precautions  Pricilla LovelessScott Reynald Woods, MD 12/24/15 [phone]

## 2015-12-25 DIAGNOSIS — I252 Old myocardial infarction: Secondary | ICD-10-CM | POA: Insufficient documentation

## 2016-01-16 DIAGNOSIS — R079 Chest pain, unspecified: Secondary | ICD-10-CM | POA: Insufficient documentation

## 2016-01-17 DIAGNOSIS — Z87891 Personal history of nicotine dependence: Secondary | ICD-10-CM | POA: Insufficient documentation

## 2016-01-17 DIAGNOSIS — R9431 Abnormal electrocardiogram [ECG] [EKG]: Secondary | ICD-10-CM | POA: Insufficient documentation

## 2016-02-20 DIAGNOSIS — Z8249 Family history of ischemic heart disease and other diseases of the circulatory system: Secondary | ICD-10-CM | POA: Insufficient documentation

## 2016-03-06 DIAGNOSIS — R739 Hyperglycemia, unspecified: Secondary | ICD-10-CM | POA: Insufficient documentation

## 2016-12-02 DIAGNOSIS — M1712 Unilateral primary osteoarthritis, left knee: Secondary | ICD-10-CM | POA: Insufficient documentation
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
## 2017-07-07 DIAGNOSIS — F3181 Bipolar II disorder: Secondary | ICD-10-CM | POA: Insufficient documentation

## 2017-07-07 DIAGNOSIS — E039 Hypothyroidism, unspecified: Secondary | ICD-10-CM | POA: Insufficient documentation

## 2017-07-26 IMAGING — DX DG CHEST 2V
2 series · 2 of 2 positions shown · non-contrast
Comparison: June 20, 2011

CLINICAL DATA: Chest pain for 1 hour.

EXAM:
CHEST  2 VIEW

[w chest pa]
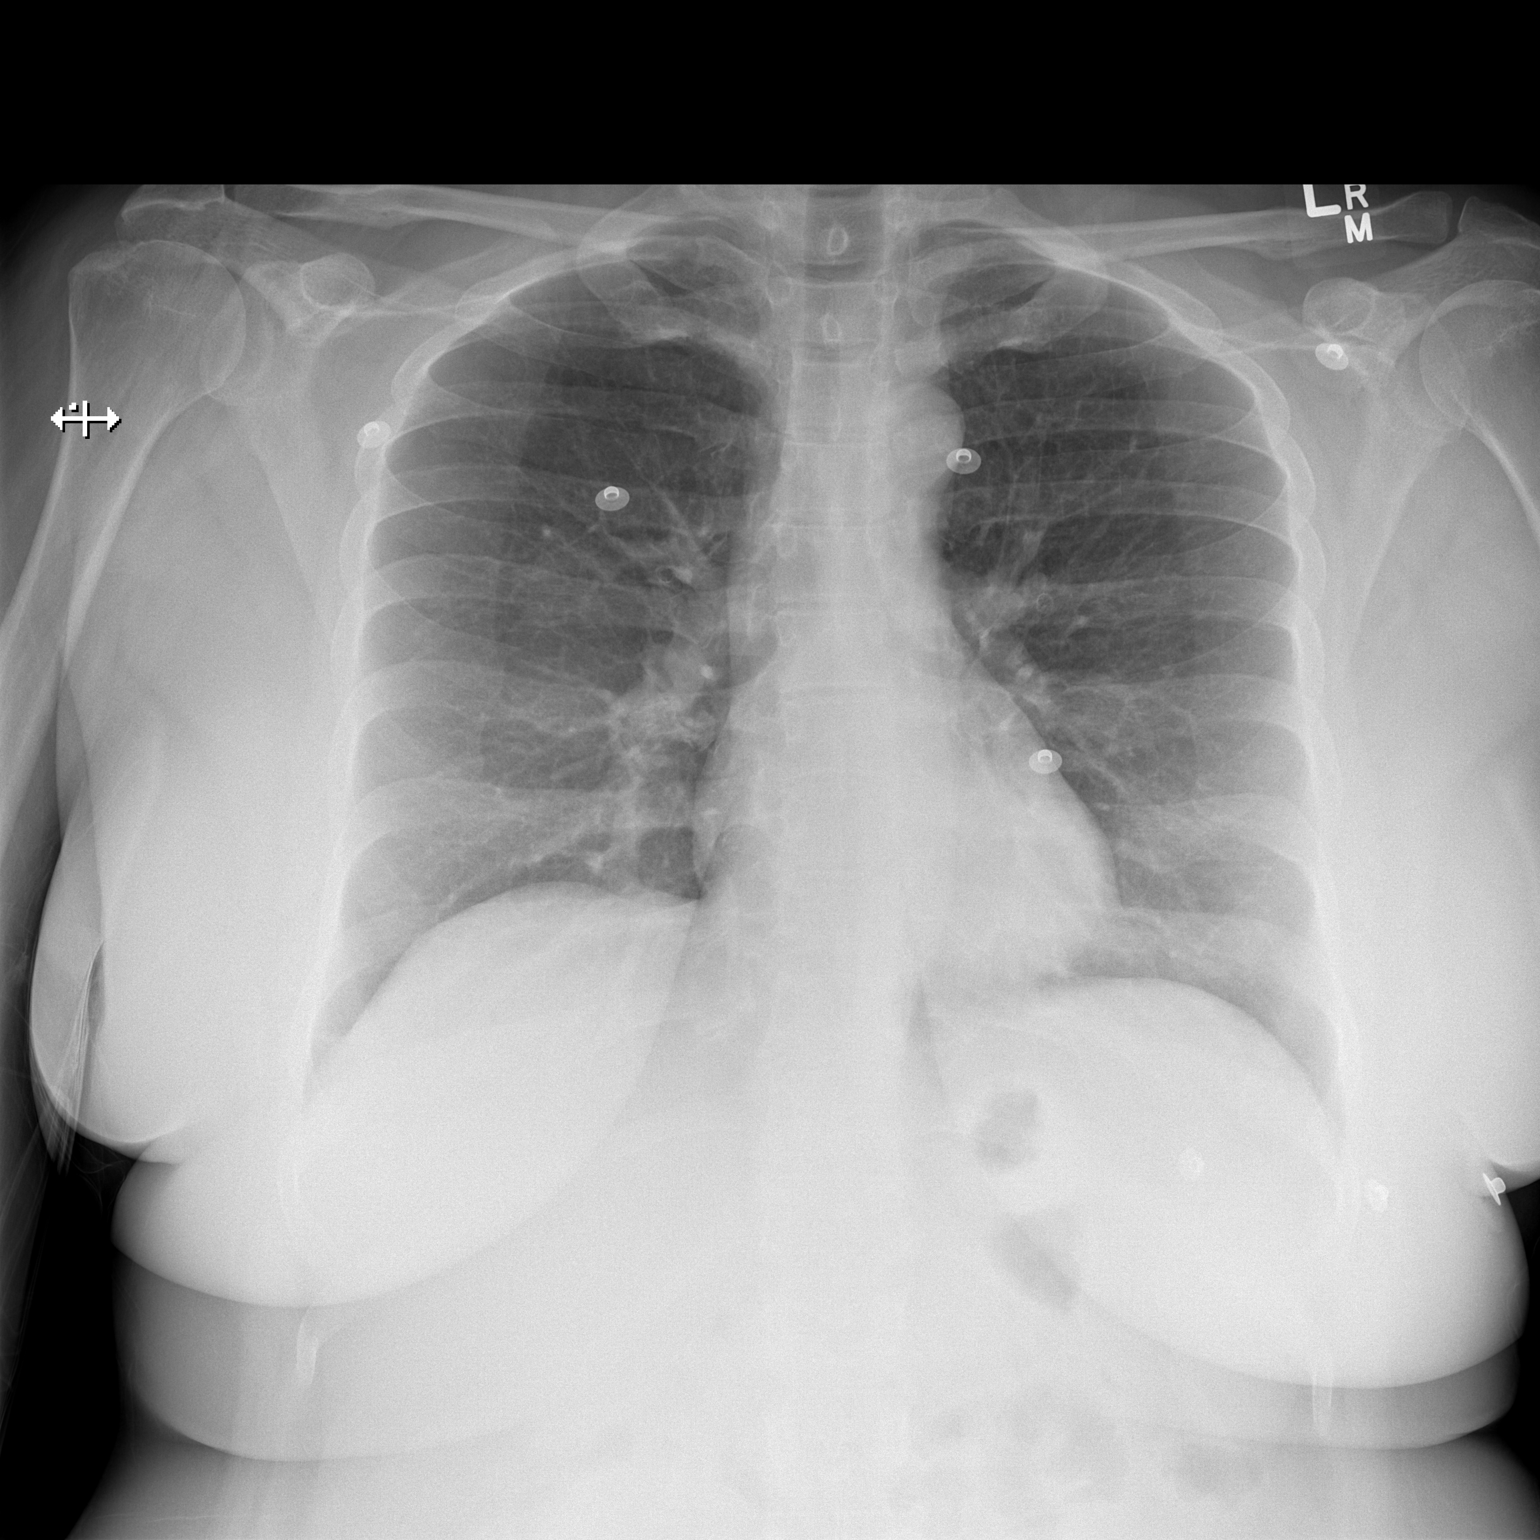

[w chest lat]
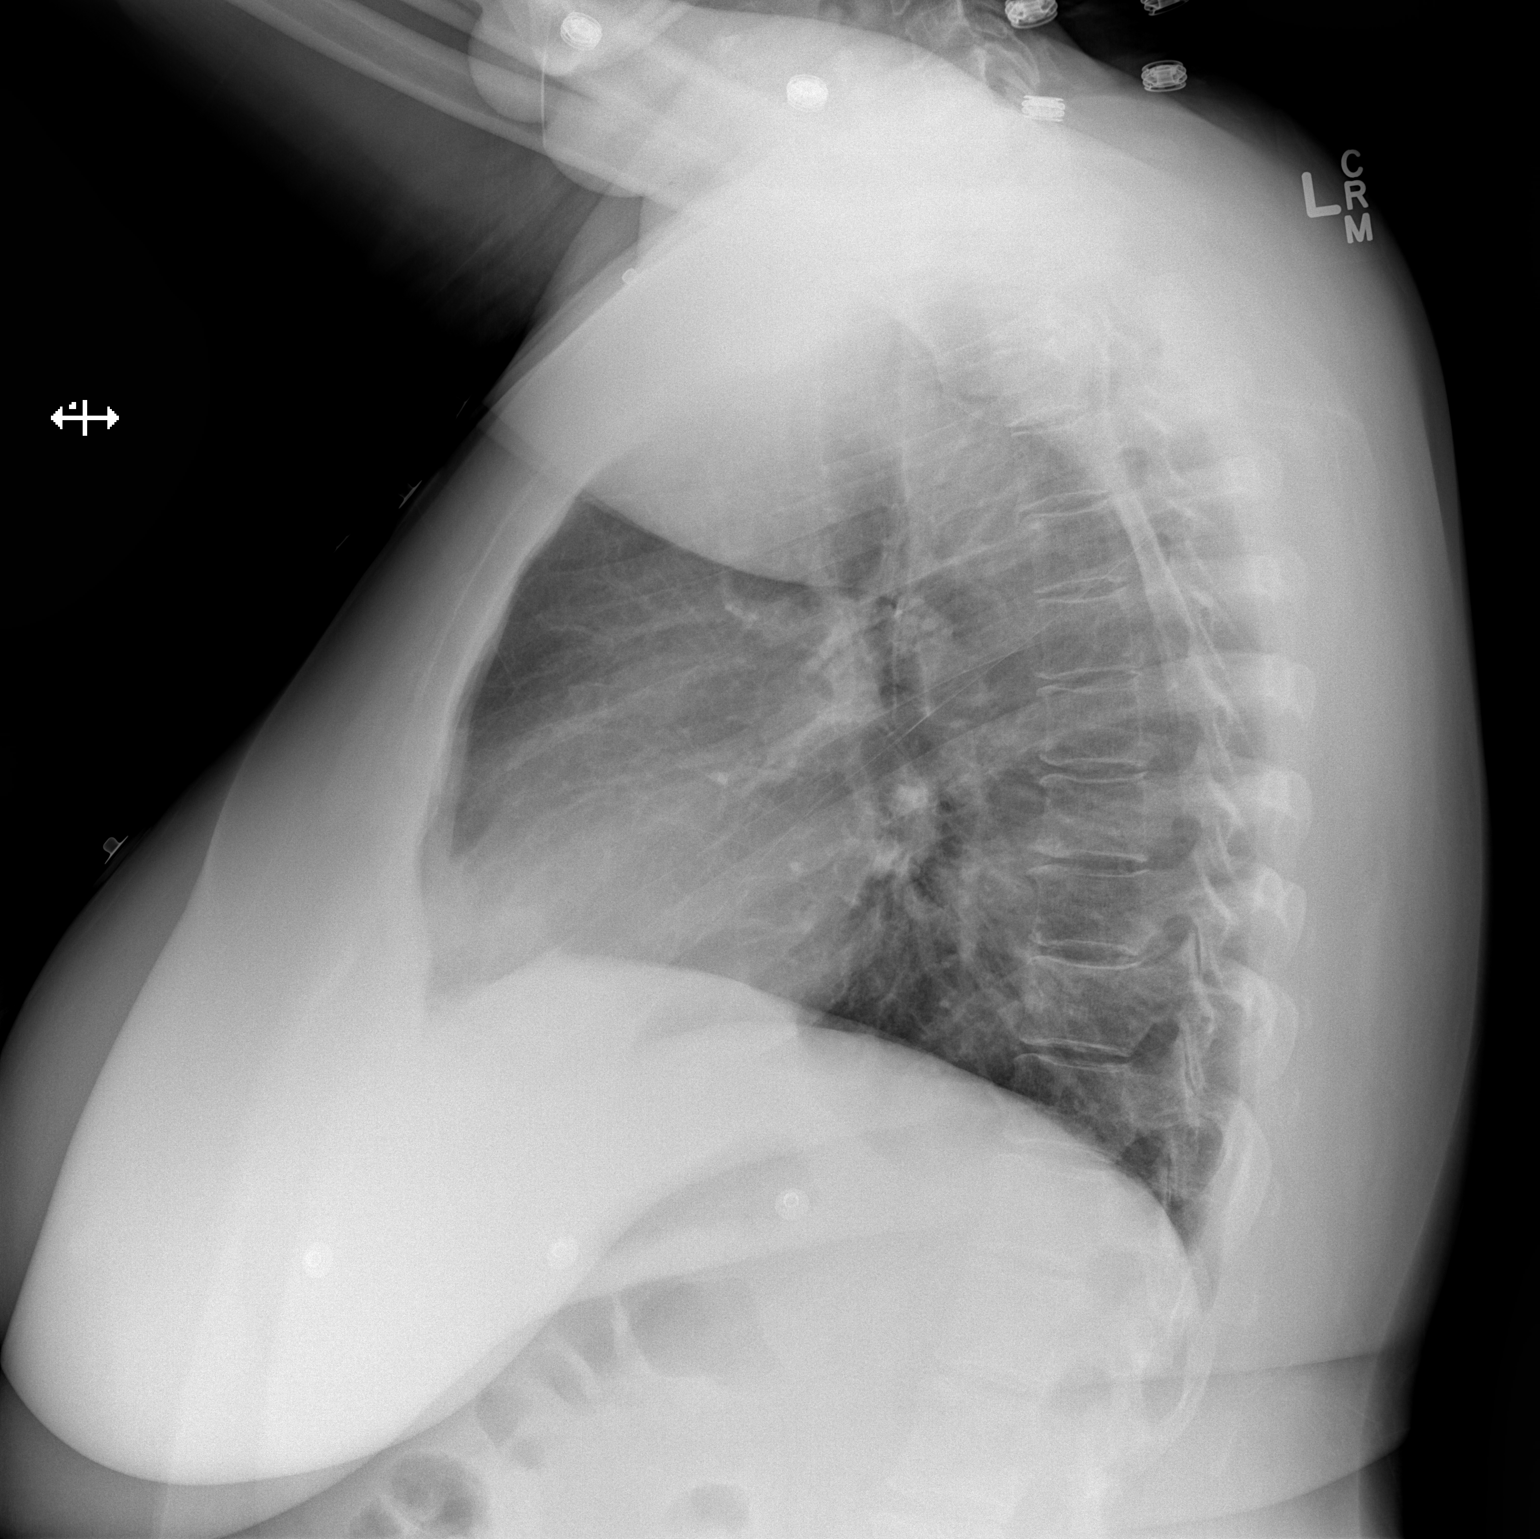

[2 of 2 positions shown; findings below may reference images not displayed]

FINDINGS: The heart, hila, and mediastinum are normal. The aorta is not
significantly changed since 0970. No pneumothorax. No pulmonary
nodules, masses, or focal infiltrates.
IMPRESSION: No acute abnormalities are identified. Given the history of tearing
chest pain radiating to the back, if there is concern for aortic
pathology, a CTA of the chest would be more sensitive.

## 2017-10-08 DIAGNOSIS — Z9889 Other specified postprocedural states: Secondary | ICD-10-CM | POA: Insufficient documentation

## 2018-09-13 DIAGNOSIS — T84498A Other mechanical complication of other internal orthopedic devices, implants and grafts, initial encounter: Secondary | ICD-10-CM | POA: Insufficient documentation

## 2018-10-21 DIAGNOSIS — M5416 Radiculopathy, lumbar region: Secondary | ICD-10-CM | POA: Insufficient documentation

## 2018-10-21 DIAGNOSIS — M51369 Other intervertebral disc degeneration, lumbar region without mention of lumbar back pain or lower extremity pain: Secondary | ICD-10-CM | POA: Insufficient documentation

## 2018-11-11 DIAGNOSIS — G5761 Lesion of plantar nerve, right lower limb: Secondary | ICD-10-CM | POA: Insufficient documentation

## 2018-12-07 DIAGNOSIS — G5751 Tarsal tunnel syndrome, right lower limb: Secondary | ICD-10-CM | POA: Insufficient documentation

## 2019-05-22 ENCOUNTER — Emergency Department (INDEPENDENT_AMBULATORY_CARE_PROVIDER_SITE_OTHER)
Admission: EM | Admit: 2019-05-22 | Discharge: 2019-05-22 | Disposition: A | Discharge status: Home / Self Care | Payer: Self-pay | Source: Home / Self Care

## 2019-05-22 ENCOUNTER — Emergency Department (INDEPENDENT_AMBULATORY_CARE_PROVIDER_SITE_OTHER): Payer: BC Managed Care – PPO

## 2019-05-22 ENCOUNTER — Other Ambulatory Visit: Payer: Self-pay

## 2019-05-22 DIAGNOSIS — S59901A Unspecified injury of right elbow, initial encounter: Secondary | ICD-10-CM | POA: Diagnosis not present

## 2019-05-22 DIAGNOSIS — Y990 Civilian activity done for income or pay: Secondary | ICD-10-CM

## 2019-05-22 DIAGNOSIS — W010XXA Fall on same level from slipping, tripping and stumbling without subsequent striking against object, initial encounter: Secondary | ICD-10-CM

## 2019-05-22 DIAGNOSIS — S4991XA Unspecified injury of right shoulder and upper arm, initial encounter: Secondary | ICD-10-CM

## 2019-05-22 DIAGNOSIS — W19XXXA Unspecified fall, initial encounter: Secondary | ICD-10-CM

## 2019-05-22 DIAGNOSIS — M25531 Pain in right wrist: Secondary | ICD-10-CM

## 2019-05-22 MED ORDER — IBUPROFEN 600 MG PO TABS: 600.0000 mg | ORAL_TABLET | Freq: Four times a day (QID) | ORAL | 0 refills | Status: AC | PRN

## 2019-05-22 MED ORDER — TRAMADOL HCL 50 MG PO TABS: 50.0000 mg | ORAL_TABLET | Freq: Four times a day (QID) | ORAL | 0 refills | Status: AC | PRN

## 2019-05-22 MED ORDER — IBUPROFEN 600 MG PO TABS
600.0000 mg | ORAL_TABLET | Freq: Once | ORAL | Status: AC
Start: 2019-05-22 — End: 2019-05-22
  Administered 2019-05-22: 600 mg via ORAL
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
## 2019-05-22 NOTE — Discharge Instructions (Signed)
°  You may take 500mg  acetaminophen every 4-6 hours or in combination with ibuprofen 400-600mg  every 6-8 hours as needed for pain and inflammation.  Tramadol is strong pain medication. While taking, do not drink alcohol, drive, or perform any other activities that requires focus while taking these medications.   Please call Employee Health/Worker's Comp for further evaluation and on going care of your injury. You may need a referral to an orthopedist or for more imaging to rule out a rotator cuff injury.

## 2019-05-22 NOTE — ED Provider Notes (Signed)
[facility]    CSN: [id number] Arrival date & time: 05/22/19  1918      History   Chief Complaint Chief Complaint  Patient presents with  . Fall    HPI Klohe Lovering is a 48 y.o. female.   HPI Sanela Evola is a 48 y.o. female presenting to UC with c/o Right arm pain that started about 1 hour PTA. Pt works at a nursing home, tripped on a rug and fell onto her Right side.  Pain is aching and sore, limited ROM due to severe pain.  Pt also reports mild Left arm pain but denies hitting her Left arm on anything. No prior hx of Right shoulder or elbow issues.  No medication taken PTA. She is Right hand dominant. Denies hitting her head during the fall.   BP elevated. No hx of HTN. Pt believes it is due to the pain.  Past Medical History:  Diagnosis Date  . ADD (attention deficit disorder)     There are no active problems to display for this patient.   Past Surgical History:  Procedure Laterality Date  . ANKLE SURGERY    . CESAREAN SECTION      OB History   No obstetric history on file.      Home Medications    Prior to Admission medications   Medication Sig Start Date End Date Taking? Authorizing Provider  celecoxib (CELEBREX) 200 MG capsule Take 200 mg by mouth 2 (two) times daily.   Yes [provider]  lamoTRIgine (LAMICTAL) 150 MG tablet Take 150 mg by mouth daily.   Yes [provider]  levothyroxine (SYNTHROID) 75 MCG tablet Take 75 mcg by mouth daily before breakfast.   Yes [provider]  orphenadrine (NORFLEX) 100 MG tablet Take 100 mg by mouth 2 (two) times daily.   Yes [provider]  pregabalin (LYRICA) 75 MG capsule Take 75 mg by mouth 2 (two) times daily.   Yes [provider]  albuterol (PROVENTIL HFA;VENTOLIN HFA) 108 (90 Base) MCG/ACT inhaler Inhale 1 puff into the lungs every 6 (six) hours as needed for wheezing or shortness of breath.    [provider]   amphetamine-dextroamphetamine (ADDERALL) 20 MG tablet Take 20 mg by mouth daily.    [provider]  ibuprofen (ADVIL) 600 MG tablet Take 1 tablet (600 mg total) by mouth every 6 (six) hours as needed for moderate pain. 05/22/19   Lurene Shadow, PA-C  mometasone-formoterol (DULERA) 100-5 MCG/ACT AERO Inhale 2 puffs into the lungs 2 (two) times daily.    [provider]  naproxen (NAPROSYN) 500 MG tablet Take 1 tablet (500 mg total) by mouth 2 (two) times daily with a meal. 12/23/15   Pricilla Loveless, MD  omeprazole (PRILOSEC) 10 MG capsule Take 10 mg by mouth daily.    [provider]  traMADol (ULTRAM) 50 MG tablet Take 1 tablet (50 mg total) by mouth every 6 (six) hours as needed for moderate pain or severe pain. 05/22/19   Lurene Shadow, PA-C    Family History Family History  Problem Relation Age of Onset  . Heart failure Mother   . Heart attack Mother   . Hypertension Father   . Heart attack Father   . Hypertension Sister   . Heart failure Brother     Social History Social History   Tobacco Use  . Smoking status: Former Smoker    Years: 4.00    Types: Cigarettes  .  Smokeless tobacco: Never Used  . 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 Drug use: No     Allergies   Bactrim ds [sulfamethoxazole-trimethoprim] and Terbinafine hcl   Review of Systems Review of Systems  Musculoskeletal: Positive for arthralgias and myalgias. Negative for neck pain and neck stiffness.  Skin: Positive for color change. Negative for wound.  Neurological: Positive for weakness. Negative for dizziness, light-headedness and numbness.     Physical Exam Triage Vital Signs ED Triage Vitals  Enc Vitals Group     BP 05/22/19 2001 (!) 146/103     Pulse Rate 05/22/19 2001 (!) 101     Resp 05/22/19 2001 20     Temp 05/22/19 2001 98.5 F (36.9 C)     Temp src --      SpO2 05/22/19 2001 96 %     Weight  05/22/19 1955 183 lb (83 kg)     Height --      Head Circumference --      Peak Flow --      Pain Score --      Pain Loc --      Pain Edu? --      Excl. in [place]? --    No data found.  Updated Vital Signs BP (!) 146/103 (BP Location: Left Wrist)   Pulse (!) 101   Temp 98.5 F (36.9 C)   Resp 20   Wt 183 lb (83 kg)   LMP 05/19/2019 (Exact Date)   SpO2 96%   BMI 33.47 kg/m   Visual Acuity Right Eye Distance:   Left Eye Distance:   Bilateral Distance:    Right Eye Near:   Left Eye Near:    Bilateral Near:     Physical Exam Vitals signs and nursing note reviewed.  Constitutional:      Appearance: Normal appearance. She is well-developed.  HENT:     Head: Normocephalic and atraumatic.     Nose: Nose normal.  Eyes:     Extraocular Movements: Extraocular movements intact.     Pupils: Pupils are equal, round, and reactive to light.  Neck:     Musculoskeletal: Normal range of motion and neck supple.     Comments: No midline bone tenderness, no crepitus or step-offs.  Cardiovascular:     Rate and Rhythm: Normal rate and regular rhythm.     Pulses:          Radial pulses are 2+ on the right side.  Pulmonary:     Effort: Pulmonary effort is normal. No respiratory distress.     Breath sounds: Normal breath sounds.  Chest:     Chest wall: No tenderness.  Musculoskeletal:        General: Swelling and tenderness present.     Comments: Right arm: pt guarding across her chest. Tenderness to shoulder, elbow and wrist. Limited ROM. Mild edema to lateral elbow. 4/5 grip strength due to pain. No tenderness in hand.  Left arm: full ROM, no tenderness.   Skin:    General: Skin is warm and dry.     Findings: Bruising (Right lateral elbow) present.  Neurological:     Mental Status: She is alert and oriented to person, place, and time.     Sensory: No sensory deficit.  Psychiatric:        Behavior: Behavior normal.      UC Treatments / Results  Labs (all labs ordered are  listed, but only abnormal results are displayed) Labs Reviewed - No data to display  EKG   Radiology Dg Shoulder Right  Result Date: 05/22/2019 CLINICAL DATA:  Recent fall with right arm pain, initial encounter EXAM: RIGHT SHOULDER - 2+ VIEW COMPARISON:  None. FINDINGS: Mild degenerative changes of the acromioclavicular joint are seen. No fracture or dislocation is noted. Some remodeling of the acromion is seen likely related to a high-riding humeral head. No soft tissue abnormality is noted. IMPRESSION: Degenerative changes without acute abnormality. Electronically Signed   By: Alcide CleverMark  Lukens M.D.   On: 05/22/2019 20:26   Dg Elbow Complete Right  Result Date: 05/22/2019 CLINICAL DATA:  Recent fall with elbow pain, initial encounter EXAM: RIGHT ELBOW - COMPLETE 3+ VIEW COMPARISON:  None. FINDINGS: No acute fracture or dislocation is noted. No joint effusion is seen. No soft tissue abnormality is noted. IMPRESSION: No acute abnormality seen. Electronically Signed   By: Alcide CleverMark  Lukens M.D.   On: 05/22/2019 20:27   Dg Wrist Complete Right  Result Date: 05/22/2019 CLINICAL DATA:  Recent fall with right wrist pain, initial encounter EXAM: RIGHT WRIST - COMPLETE 3+ VIEW COMPARISON:  None. FINDINGS: There is no evidence of fracture or dislocation. There is no evidence of arthropathy or other focal bone abnormality. Soft tissues are unremarkable. IMPRESSION: No acute abnormality noted. Electronically Signed   By: Alcide CleverMark  Lukens M.D.   On: 05/22/2019 20:28    Procedures Procedures (including critical care time)  Medications Ordered in UC Medications  ibuprofen (ADVIL) tablet 600 mg (600 mg Oral Given 05/22/19 2005)    Initial Impression / Assessment and Plan / UC Course  I have reviewed the triage vital signs and the nursing notes.  Pertinent labs & imaging results that were available during my care of the patient were reviewed by me and considered in my medical decision making (see chart for  details).     Reviewed imaging with pt Will tx as possible rotator cuff injury due to limited ROM and reported MOI Encouraged f/u with worker's comp for likely referral to orthopedist  AVS provided  Final Clinical Impressions(s) / UC Diagnoses   Final diagnoses:  Elbow injury, right, initial encounter  Fall from slip, trip, or stumble, initial encounter  Shoulder injury, right, initial encounter  Work related injury  Right wrist pain     Discharge Instructions      You may take 500mg  acetaminophen every 4-6 hours or in combination with ibuprofen 400-600mg  every 6-8 hours as needed for pain and inflammation.  Tramadol is strong pain medication. While taking, do not drink alcohol, drive, or perform any other activities that requires focus while taking these medications.   Please call Employee Health/Worker's Comp for further evaluation and on going care of your injury. You may need a referral to an orthopedist or for more imaging to rule out a rotator cuff injury.      ED Prescriptions    Medication Sig Dispense Auth. Provider   traMADol (ULTRAM) 50 MG tablet Take 1 tablet (50 mg total) by mouth every 6 (six) hours as needed for moderate pain or severe pain. 8 tablet Doroteo GlassmanPhelps, Kingsley Farace O, PA-C   ibuprofen (ADVIL) 600 MG tablet Take 1 tablet (600 mg total) by mouth every 6 (six) hours as needed for moderate pain. 20 tablet Lurene ShadowPhelps, Esmee Fallaw O, [place]PA-C     I have reviewed the PDMP during this encounter.   Lurene Shadowhelps, Zahmir Lalla O, [place]PA-C 05/23/19 [phone]

## 2019-09-20 DIAGNOSIS — M7551 Bursitis of right shoulder: Secondary | ICD-10-CM | POA: Insufficient documentation

## 2020-09-09 DIAGNOSIS — N951 Menopausal and female climacteric states: Secondary | ICD-10-CM | POA: Insufficient documentation

## 2021-07-03 DIAGNOSIS — G8929 Other chronic pain: Secondary | ICD-10-CM | POA: Insufficient documentation

## 2021-10-10 ENCOUNTER — Ambulatory Visit
Admission: EM | Admit: 2021-10-10 | Discharge: 2021-10-10 | Disposition: A | Discharge status: Home / Self Care | Payer: BC Managed Care – PPO | Attending: Family Medicine | Admitting: Family Medicine

## 2021-10-10 ENCOUNTER — Encounter: Payer: Self-pay | Admitting: Emergency Medicine

## 2021-10-10 DIAGNOSIS — L03116 Cellulitis of left lower limb: Secondary | ICD-10-CM | POA: Diagnosis not present

## 2021-10-10 MED ORDER — AMOXICILLIN-POT CLAVULANATE 875-125 MG PO TABS: 1.0000 | ORAL_TABLET | Freq: Two times a day (BID) | ORAL | 0 refills | Status: AC

## 2021-10-10 MED ORDER — TRIAMCINOLONE ACETONIDE 40 MG/ML IJ SUSP
40.0000 mg | Freq: Once | INTRAMUSCULAR | Status: AC
  Administered 2021-10-10: 40 mg via INTRAMUSCULAR

## 2021-10-10 NOTE — ED Triage Notes (Signed)
Patient states that her left 5th toe started itching Wednesday night, woke up yesterday to her entire foot being red and swollen.  No injury to the foot.  Patient has taken Benadryl and Tylenol. ?

## 2021-10-10 NOTE — ED Provider Notes (Signed)
?[facility] ? ? ? ?CSN: [id number] ?Arrival date & time: 10/10/21  0177 ? ? ?  ? ?History   ?Chief Complaint ?Chief Complaint  ?Patient presents with  ? Foot Pain  ? ? ?HPI ?Mary Silva is a 51 y.o. female.  ? ? ?Foot Pain ? ?Here for itching and swelling in her left foot.  First on the evening of April 12 that she noticed intense itching of her left pinky toe.  She tried some antihistamines which did not help a lot.  Then she began having more itching and the rest of her foot and swelling in the dorsum of her foot.  No fever or chills noted so far ? ?Past Medical History:  ?Diagnosis Date  ? ADD (attention deficit disorder)   ? ? ?There are no problems to display for this patient. ? ? ?Past Surgical History:  ?Procedure Laterality Date  ? ANKLE SURGERY    ? CESAREAN SECTION    ? ? ?OB History   ?No obstetric history on file. ?  ? ? ? ?Home Medications   ? ?Prior to Admission medications   ?Medication Sig Start Date End Date Taking? Authorizing Provider  ?albuterol (PROVENTIL HFA;VENTOLIN HFA) 108 (90 Base) MCG/ACT inhaler Inhale 1 puff into the lungs every 6 (six) hours as needed for wheezing or shortness of breath.   Yes [provider]  ?amoxicillin-clavulanate (AUGMENTIN) 875-125 MG tablet Take 1 tablet by mouth 2 (two) times daily for 7 days. 10/10/21 10/17/21 Yes Antionette Luster, Janace Aris, MD  ?amphetamine-dextroamphetamine (ADDERALL) 20 MG tablet Take 20 mg by mouth daily.   Yes [provider]  ?celecoxib (CELEBREX) 200 MG capsule Take 200 mg by mouth 2 (two) times daily.   Yes [provider]  ?ibuprofen (ADVIL) 600 MG tablet Take 1 tablet (600 mg total) by mouth every 6 (six) hours as needed for moderate pain. 05/22/19  Yes Lurene Shadow, PA-C  ?lamoTRIgine (LAMICTAL) 150 MG tablet Take 150 mg by mouth daily.   Yes [provider]  ?levothyroxine (SYNTHROID) 75 MCG tablet Take 75 mcg by mouth daily before breakfast.   Yes [provider]   ?mometasone-formoterol (DULERA) 100-5 MCG/ACT AERO Inhale 2 puffs into the lungs 2 (two) times daily.   Yes [provider]  ?orphenadrine (NORFLEX) 100 MG tablet Take 100 mg by mouth 2 (two) times daily.   Yes [provider]  ?naproxen (NAPROSYN) 500 MG tablet Take 1 tablet (500 mg total) by mouth 2 (two) times daily with a meal. 12/23/15   Pricilla Loveless, MD  ?omeprazole (PRILOSEC) 10 MG capsule Take 10 mg by mouth daily.    [provider]  ?pregabalin (LYRICA) 75 MG capsule Take 75 mg by mouth 2 (two) times daily.    [provider]  ?traMADol (ULTRAM) 50 MG tablet Take 1 tablet (50 mg total) by mouth every 6 (six) hours as needed for moderate pain or severe pain. 05/22/19   Lurene Shadow, PA-C  ? ? ?Family History ?Family History  ?Problem Relation Age of Onset  ? Heart failure Mother   ? Heart attack Mother   ? Hypertension Father   ? Heart attack Father   ? Hypertension Sister   ? Heart failure Brother   ? ? ?Social History ?Social History  ? ?Tobacco Use  ? Smoking status: Former  ?  Years: 4.00  ?  Types: Cigarettes  ? Smokeless tobacco: Never  ? Tobacco comments:  ?  4 years sober  ?  Substance Use Topics  ? Alcohol use: No  ?  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 Drug use: No  ? ? ? ?Allergies   ?Bactrim ds [sulfamethoxazole-trimethoprim] and Terbinafine hcl ? ? ?Review of Systems ?Review of Systems ? ? ?Physical Exam ?Triage Vital Signs ?ED Triage Vitals  ?Enc Vitals Group  ?   BP 10/10/21 0946 133/67  ?   Pulse Rate 10/10/21 0946 98  ?   Resp 10/10/21 0946 18  ?   Temp 10/10/21 0946 98.1 ?F (36.7 ?C)  ?   Temp Source 10/10/21 0946 Oral  ?   SpO2 10/10/21 0946 93 %  ?   Weight 10/10/21 0947 190 lb (86.2 kg)  ?   Height 10/10/21 0947 5\' 2"  (1.575 m)  ?   Head Circumference --   ?   Peak Flow --   ?   Pain Score 10/10/21 0947 7  ?   Pain Loc --   ?   Pain Edu? --   ?   Excl. in [place]? --   ? ?No data found. ? ?Updated Vital Signs ?BP 133/67 (BP Location: Left Arm)   Pulse 98    Temp 98.1 ?F (36.7 ?C) (Oral)   Resp 18   Ht 5\' 2"  (1.575 m)   Wt 86.2 kg   LMP 05/19/2019 (Exact Date)   SpO2 93%   BMI 34.75 kg/m?  ? ?Visual Acuity ?Right Eye Distance:   ?Left Eye Distance:   ?Bilateral Distance:   ? ?Right Eye Near:   ?Left Eye Near:    ?Bilateral Near:    ? ?Physical Exam ?Vitals reviewed.  ?Constitutional:   ?   General: She is not in acute distress. ?   Appearance: She is not toxic-appearing.  ?Skin: ?   Coloration: Skin is not jaundiced or pale.  ?   Comments: There is erythema and swelling of the left fifth toe.  There is also some swelling and mild erythema of the dorsum of the left foot  ?Neurological:  ?   Mental Status: She is alert and oriented to person, place, and time.  ?Psychiatric:     ?   Behavior: Behavior normal.  ? ? ? ?UC Treatments / Results  ?Labs ?(all labs ordered are listed, but only abnormal results are displayed) ?Labs Reviewed - No data to display ? ?EKG ? ? ?Radiology ?No results found. ? ?Procedures ?Procedures (including critical care time) ? ?Medications Ordered in UC ?Medications  ?triamcinolone acetonide (KENALOG-40) injection 40 mg (has no administration in time range)  ? ? ?Initial Impression / Assessment and Plan / UC Course  ?I have reviewed the triage vital signs and the nursing notes. ? ?Pertinent labs & imaging results that were available during my care of the patient were reviewed by me and considered in my medical decision making (see chart for details). ? ?  ? ?Cellulitis versus insect bite and reaction to that.  We will treat with steroid injection and antibiotics orally ?Final Clinical Impressions(s) / UC Diagnoses  ? ?Final diagnoses:  ?Cellulitis of left lower extremity  ? ? ? ?Discharge Instructions   ? ?  ?Take amoxicillin-clavulanate 875 mg--1 tab twice daily with food for 7 days  ? ?You have been given a shot of triamcinolone 40 mg ? ? ? ? ? ? ?ED Prescriptions   ? ? Medication Sig Dispense Auth. Provider  ? amoxicillin-clavulanate  (AUGMENTIN) 875-125 MG tablet Take 1 tablet by mouth 2 (two) times daily for 7  days. 14 tablet Marlinda MikeBanister, Janace ArisPamela K, MD  ? ?  ? ?PDMP not reviewed this encounter. ?  ?Zenia ResidesBanister, Jennessy Sandridge K, MD ?10/10/21 1007 ? ?

## 2021-10-10 NOTE — Discharge Instructions (Addendum)
Take amoxicillin-clavulanate 875 mg--1 tab twice daily with food for 7 days  ? ?You have been given a shot of triamcinolone 40 mg ? ? ?

## 2022-02-04 DIAGNOSIS — R6882 Decreased libido: Secondary | ICD-10-CM | POA: Insufficient documentation

## 2022-02-04 DIAGNOSIS — F330 Major depressive disorder, recurrent, mild: Secondary | ICD-10-CM | POA: Insufficient documentation

## 2022-10-27 DIAGNOSIS — S82892A Other fracture of left lower leg, initial encounter for closed fracture: Secondary | ICD-10-CM | POA: Insufficient documentation

## 2022-10-31 ENCOUNTER — Emergency Department (HOSPITAL_COMMUNITY)
Admission: EM | Admit: 2022-10-31 | Discharge: 2022-10-31 | Disposition: A | Discharge status: Home / Self Care | Payer: 59 | Attending: Emergency Medicine | Admitting: Emergency Medicine

## 2022-10-31 ENCOUNTER — Encounter (HOSPITAL_COMMUNITY): Payer: Self-pay | Admitting: *Deleted

## 2022-10-31 ENCOUNTER — Other Ambulatory Visit: Payer: Self-pay

## 2022-10-31 DIAGNOSIS — T783XXA Angioneurotic edema, initial encounter: Secondary | ICD-10-CM | POA: Insufficient documentation

## 2022-10-31 DIAGNOSIS — T7840XA Allergy, unspecified, initial encounter: Secondary | ICD-10-CM

## 2022-10-31 MED ORDER — DIPHENHYDRAMINE HCL 25 MG PO CAPS: 25.0000 mg | ORAL_CAPSULE | Freq: Four times a day (QID) | ORAL | 0 refills | Status: AC | PRN

## 2022-10-31 MED ORDER — METHYLPREDNISOLONE SODIUM SUCC 125 MG IJ SOLR
125.0000 mg | Freq: Once | INTRAMUSCULAR | Status: AC
  Administered 2022-10-31: 125 mg via INTRAVENOUS
  Filled 2022-10-31: qty 2

## 2022-10-31 MED ORDER — EPINEPHRINE 0.3 MG/0.3ML IJ SOAJ: 0.3000 mg | INTRAMUSCULAR | 0 refills | Status: AC | PRN

## 2022-10-31 MED ORDER — DIPHENHYDRAMINE HCL 25 MG PO CAPS
25.0000 mg | ORAL_CAPSULE | Freq: Once | ORAL | Status: AC
  Administered 2022-10-31: 25 mg via ORAL
  Filled 2022-10-31: qty 1

## 2022-10-31 MED ORDER — PREDNISONE 10 MG PO TABS: 50.0000 mg | ORAL_TABLET | Freq: Every day | ORAL | 0 refills | Status: AC

## 2022-10-31 MED ORDER — EPINEPHRINE 0.3 MG/0.3ML IJ SOAJ
0.3000 mg | Freq: Once | INTRAMUSCULAR | Status: AC
  Administered 2022-10-31: 0.3 mg via INTRAMUSCULAR
  Filled 2022-10-31: qty 0.3

## 2022-10-31 MED ORDER — FAMOTIDINE IN NACL 20-0.9 MG/50ML-% IV SOLN
20.0000 mg | Freq: Once | INTRAVENOUS | Status: AC
  Administered 2022-10-31: 20 mg via INTRAVENOUS
  Filled 2022-10-31: qty 50

## 2022-10-31 MED ORDER — DIPHENHYDRAMINE HCL 50 MG/ML IJ SOLN
25.0000 mg | Freq: Once | INTRAMUSCULAR | Status: AC
  Administered 2022-10-31: 25 mg via INTRAVENOUS
  Filled 2022-10-31: qty 1

## 2022-10-31 MED ORDER — FENTANYL CITRATE PF 50 MCG/ML IJ SOSY
50.0000 ug | PREFILLED_SYRINGE | Freq: Once | INTRAMUSCULAR | Status: AC
  Administered 2022-10-31: 50 ug via INTRAVENOUS
  Filled 2022-10-31: qty 1

## 2022-10-31 NOTE — ED Provider Notes (Signed)
[place] EMERGENCY DEPARTMENT AT [facility] Provider Note   CSN: [id number] Arrival date & time: 10/31/22  5621     History  Chief Complaint  Patient presents with   Allergic Reaction    Mary Silva is a 52 y.o. female.  HPI    52 year old female comes in with chief complaint of allergic reaction.  Patient states that about 3 or 4 days ago she started noticing some itching over her left leg.  She has left ankle injury, and suspected that one of the devices must have been causing irritation.  Yesterday she had surgery of her left ankle.  After the surgery the patient noted that her itching was getting worse, she had swelling to her hand.  She took Benadryl for her symptom management.  This morning she woke up and noted that her face was red, swollen and her lower lip was swollen as well.  This prompted her to come to the emergency room.  She has known allergies, but at this time she does not know what triggered these current episodes.  Patient denies any shortness of breath, nausea, vomiting.  She denies any difficulty breathing.  Home Medications Prior to Admission medications   Medication Sig Start Date End Date Taking? Authorizing Provider  diphenhydrAMINE (BENADRYL) 25 mg capsule Take 1 capsule (25 mg total) by mouth every 6 (six) hours as needed for itching. 10/31/22  Yes Jakson Delpilar, MD  EPINEPHrine 0.3 mg/0.3 mL IJ SOAJ injection Inject 0.3 mg into the muscle as needed for anaphylaxis. 10/31/22  Yes Villa Burgin, MD  predniSONE (DELTASONE) 10 MG tablet Take 5 tablets (50 mg total) by mouth daily. 10/31/22  Yes Derwood Kaplan, MD  albuterol (PROVENTIL HFA;VENTOLIN HFA) 108 (90 Base) MCG/ACT inhaler Inhale 1 puff into the lungs every 6 (six) hours as needed for wheezing or shortness of breath.    [provider]  amphetamine-dextroamphetamine (ADDERALL) 20 MG tablet Take 20 mg by mouth daily.    [provider]  celecoxib (CELEBREX) 200 MG  capsule Take 200 mg by mouth 2 (two) times daily.    [provider]  ibuprofen (ADVIL) 600 MG tablet Take 1 tablet (600 mg total) by mouth every 6 (six) hours as needed for moderate pain. 05/22/19   Lurene Shadow, PA-C  lamoTRIgine (LAMICTAL) 150 MG tablet Take 150 mg by mouth daily.    [provider]  levothyroxine (SYNTHROID) 75 MCG tablet Take 75 mcg by mouth daily before breakfast.    [provider]  mometasone-formoterol (DULERA) 100-5 MCG/ACT AERO Inhale 2 puffs into the lungs 2 (two) times daily.    [provider]  naproxen (NAPROSYN) 500 MG tablet Take 1 tablet (500 mg total) by mouth 2 (two) times daily with a meal. 12/23/15   Pricilla Loveless, MD  omeprazole (PRILOSEC) 10 MG capsule Take 10 mg by mouth daily.    [provider]  orphenadrine (NORFLEX) 100 MG tablet Take 100 mg by mouth 2 (two) times daily.    [provider]  pregabalin (LYRICA) 75 MG capsule Take 75 mg by mouth 2 (two) times daily.    [provider]  traMADol (ULTRAM) 50 MG tablet Take 1 tablet (50 mg total) by mouth every 6 (six) hours as needed for moderate pain or severe pain. 05/22/19   Lurene Shadow, PA-C      Allergies    Bactrim ds [sulfamethoxazole-trimethoprim] and Terbinafine hcl    Review of Systems   Review of  Systems  All other systems reviewed and are negative.   Physical Exam Updated Vital Signs BP (!) 141/87   Pulse 79   Temp 98.2 F (36.8 C) (Oral)   Resp 20   Ht 5\' 2"  (1.575 m)   Wt 94.8 kg   LMP 05/19/2019 (Exact Date)   SpO2 98%   BMI 38.23 kg/m  Physical Exam Vitals and nursing note reviewed.  Constitutional:      Appearance: She is well-developed.  HENT:     Head: Normocephalic and atraumatic.  Eyes:     Extraocular Movements: Extraocular movements intact.  Cardiovascular:     Rate and Rhythm: Normal rate.  Pulmonary:     Effort: Pulmonary effort is normal.  Musculoskeletal:     Cervical back: Normal  range of motion and neck supple.  Skin:    General: Skin is dry.     Findings: Erythema present.     Comments: Patient has erythema and edema of the face, bilateral hand, and there are urticarial lesions to the left knee and thigh, patient also has erythema to the right upper extremity, with crusted lesions from scratching  Neurological:     Mental Status: She is alert and oriented to person, place, and time.     ED Results / Procedures / Treatments   Labs (all labs ordered are listed, but only abnormal results are displayed) Labs Reviewed - No data to display  EKG None  Radiology No results found.  Procedures .Critical Care  Performed by: Derwood Kaplan, MD Authorized by: Derwood Kaplan, MD   Critical care provider statement:    Critical care time (minutes):  32   Critical care was necessary to treat or prevent imminent or life-threatening deterioration of the following conditions: Anaphylaxis/angioedema.   Critical care was time spent personally by me on the following activities:  Development of treatment plan with patient or surrogate, discussions with consultants, evaluation of patient's response to treatment, examination of patient, ordering and review of laboratory studies, ordering and review of radiographic studies, ordering and performing treatments and interventions, pulse oximetry, re-evaluation of patient's condition and review of old charts     Medications Ordered in ED Medications  diphenhydrAMINE (BENADRYL) capsule 25 mg (has no administration in time range)  diphenhydrAMINE (BENADRYL) injection 25 mg (25 mg Intravenous Given 10/31/22 1053)  methylPREDNISolone sodium succinate (SOLU-MEDROL) 125 mg/2 mL injection 125 mg (125 mg Intravenous Given 10/31/22 1056)  famotidine (PEPCID) IVPB 20 mg premix (0 mg Intravenous Stopped 10/31/22 1130)  EPINEPHrine (EPI-PEN) injection 0.3 mg (0.3 mg Intramuscular Given 10/31/22 1135)  fentaNYL (SUBLIMAZE) injection 50 mcg (50 mcg  Intravenous Given 10/31/22 1229)    ED Course/ Medical Decision Making/ A&P Clinical Course as of 10/31/22 1318  Sat Oct 31, 2022  1317 Patient reassessed.  She feels better.  There is still some erythema over her face, and the lower extremity.  Itching has been persistent.  She has received Solu-Medrol.  Send terms improved after EpiPen per patient.  We will discharge her with EpiPen and advised prednisone and Benadryl.  Allergist follow-up requested.  Return precautions discussed. [AN]    Clinical Course User Index [AN] Derwood Kaplan, MD                             Medical Decision Making Risk Prescription drug management.   52 year old patient comes in with chief complaint of lip swelling, facial swelling, diffuse itching and rash.  Symptoms  worsened yesterday overnight after she had surgery.  She did have some itching to her left leg prior to the surgery.  She has known allergies with anaphylaxis -but she denies exposure to her known allergens.  Differential diagnosis for her includes hypersensitivity, angioedema, delayed hypersensitivity.  At this time, plan is to give her EpiPen.  Patient has received Solu-Medrol and Benadryl once she arrived to the emergency room, states that her symptoms have not improved.  She had taken Benadryl yesterday and the symptoms still worsened.  She has mild angioedema of the lower lip and feels that the throat is scratchy, she has anaphylaxis in the past.  Therefore we will proceed with EpiPen and reassess.  Final Clinical Impression(s) / ED Diagnoses Final diagnoses:  Allergic reaction, initial encounter  Angioedema, initial encounter    Rx / DC Orders ED Discharge Orders          Ordered    predniSONE (DELTASONE) 10 MG tablet  Daily        10/31/22 1312    diphenhydrAMINE (BENADRYL) 25 mg capsule  Every 6 hours PRN        10/31/22 1312    EPINEPHrine 0.3 mg/0.3 mL IJ SOAJ injection  As needed        10/31/22 1316               Derwood Kaplan, MD 10/31/22 1320

## 2022-10-31 NOTE — ED Triage Notes (Signed)
Patient presents to ed via [facility] c/o allergicn reaction onset Thurs with a  bumps on left leg around the top of her cast. States last pm rash and itching spread to right leg and arms, c/o itchy throat no hoarseness or difficulty swallowing. Lower lip is swollen., able to carry on a conversation with problem.

## 2022-10-31 NOTE — Discharge Instructions (Signed)
We saw you in the ER after you had the allergic reaction.  The reaction is severe, however, it appears to be in control and there is no increased swelling or any difficulty in breathing noted. We are not sure what caused the reaction, and it is important for you to follow up with an allergist. Please take the medications prescribed. PLEASE RETURN TO THE ER IMMEDIATELY IN CASE YOU START HAVING WORSENING SWELLING, DIFFICULTY IN BREATHING ETC.  

## 2022-10-31 NOTE — ED Provider Triage Note (Signed)
Emergency Medicine Provider Triage Evaluation Note  Mary Silva , a 52 y.o. female  was evaluated in triage.  Pt complains of allergic reaction.  Has a rash on her legs and back of neck that started last night.  Thinks that several hours ago started having some shortness of breath.  Review of Systems  Positive: See above Negative: See above  Physical Exam  BP (!) 158/93 (BP Location: Right Arm)   Pulse 83   Temp 98.2 F (36.8 C) (Oral)   Resp 20   Ht 5\' 2"  (1.575 m)   Wt 94.8 kg   LMP 05/19/2019 (Exact Date)   SpO2 98%   BMI 38.23 kg/m  Gen:   Awake, no distress  Resp:  Normal effort, no wheezing MSK:   Moves extremities without difficulty  Other:  Rash, urticarial, on legs and back of neck  Medical Decision Making  Medically screening exam initiated at 10:30 AM.  Appropriate orders placed.  Mary Silva was informed that the remainder of the evaluation will be completed by another provider, this initial triage assessment does not replace that evaluation, and the importance of remaining in the ED until their evaluation is complete.  This appear to be having an allergic reaction so we will give steroids and antihistamines.  Will hold off on epinephrine at this time but will need to monitor the patient.   Rondel Baton, MD 10/31/22 1031

## 2023-10-04 ENCOUNTER — Encounter (HOSPITAL_COMMUNITY): Payer: Self-pay

## 2023-10-04 ENCOUNTER — Other Ambulatory Visit: Payer: Self-pay

## 2023-10-04 ENCOUNTER — Ambulatory Visit: Admission: EM | Admit: 2023-10-04 | Discharge: 2023-10-04 | Disposition: A | Discharge status: Home / Self Care

## 2023-10-04 ENCOUNTER — Emergency Department (HOSPITAL_COMMUNITY)

## 2023-10-04 ENCOUNTER — Emergency Department (HOSPITAL_COMMUNITY): Admission: EM | Admit: 2023-10-04 | Discharge: 2023-10-04 | Disposition: A | Discharge status: Home / Self Care

## 2023-10-04 DIAGNOSIS — R079 Chest pain, unspecified: Secondary | ICD-10-CM

## 2023-10-04 DIAGNOSIS — N390 Urinary tract infection, site not specified: Secondary | ICD-10-CM | POA: Insufficient documentation

## 2023-10-04 LAB — CBC
HCT: 39.8 % (ref 36.0–46.0)
Hemoglobin: 12.7 g/dL (ref 12.0–15.0)
MCH: 28.4 pg (ref 26.0–34.0)
MCHC: 31.9 g/dL (ref 30.0–36.0)
MCV: 89 fL (ref 80.0–100.0)
Platelets: 324 10*3/uL (ref 150–400)
RBC: 4.47 MIL/uL (ref 3.87–5.11)
RDW: 13.5 % (ref 11.5–15.5)
WBC: 11.6 10*3/uL — ABNORMAL HIGH (ref 4.0–10.5)
nRBC: 0 % (ref 0.0–0.2)

## 2023-10-04 LAB — COMPREHENSIVE METABOLIC PANEL WITH GFR
ALT: 25 U/L (ref 0–44)
AST: 20 U/L (ref 15–41)
Albumin: 4.5 g/dL (ref 3.5–5.0)
Alkaline Phosphatase: 80 U/L (ref 38–126)
Anion gap: 12 (ref 5–15)
BUN: 13 mg/dL (ref 6–20)
CO2: 24 mmol/L (ref 22–32)
Calcium: 9.5 mg/dL (ref 8.9–10.3)
Chloride: 103 mmol/L (ref 98–111)
Creatinine, Ser: 0.69 mg/dL (ref 0.44–1.00)
GFR, Estimated: >60 mL/min (ref >60)
Glucose, Bld: 86 mg/dL (ref 70–99)
Potassium: 3.7 mmol/L (ref 3.5–5.1)
Sodium: 139 mmol/L (ref 135–145)
Total Bilirubin: 0.4 mg/dL (ref 0.0–1.2)
Total Protein: 7.7 g/dL (ref 6.5–8.1)

## 2023-10-04 LAB — URINALYSIS, ROUTINE W REFLEX MICROSCOPIC
Bilirubin Urine: NEGATIVE
Glucose, UA: 100 mg/dL — AB
Hgb urine dipstick: NEGATIVE
Ketones, ur: NEGATIVE mg/dL
Leukocytes,Ua: NEGATIVE
Nitrite: POSITIVE — AB
Protein, ur: 30 mg/dL — AB
Specific Gravity, Urine: >1.03 — ABNORMAL HIGH (ref 1.005–1.030)
pH: 5 (ref 5.0–8.0)

## 2023-10-04 LAB — URINALYSIS, MICROSCOPIC (REFLEX)

## 2023-10-04 LAB — TROPONIN I (HIGH SENSITIVITY): Troponin I (High Sensitivity): 3 ng/L (ref <18)

## 2023-10-04 MED ORDER — CIPROFLOXACIN HCL 500 MG PO TABS: 500.0000 mg | ORAL_TABLET | Freq: Two times a day (BID) | ORAL | 0 refills | Status: AC

## 2023-10-04 MED ORDER — LIDOCAINE 4 % EX PTCH: 1.0000 | MEDICATED_PATCH | CUTANEOUS | 0 refills | Status: AC

## 2023-10-04 MED ORDER — LIDOCAINE 5 % EX PTCH
1.0000 | MEDICATED_PATCH | Freq: Once | CUTANEOUS | Status: DC
  Administered 2023-10-04: 1 via TRANSDERMAL
  Filled 2023-10-04: qty 1

## 2023-10-04 MED ORDER — METHOCARBAMOL 500 MG PO TABS
500.0000 mg | ORAL_TABLET | Freq: Once | ORAL | Status: AC
  Administered 2023-10-04: 500 mg via ORAL
  Filled 2023-10-04: qty 1

## 2023-10-04 MED ORDER — KETOROLAC TROMETHAMINE 15 MG/ML IJ SOLN
15.0000 mg | Freq: Once | INTRAMUSCULAR | Status: AC
  Administered 2023-10-04: 15 mg via INTRAVENOUS
  Filled 2023-10-04: qty 1

## 2023-10-04 MED ORDER — ACETAMINOPHEN 325 MG PO TABS
650.0000 mg | ORAL_TABLET | Freq: Once | ORAL | Status: AC
  Administered 2023-10-04: 650 mg via ORAL
  Filled 2023-10-04: qty 2

## 2023-10-04 MED ORDER — METHOCARBAMOL 500 MG PO TABS: 500.0000 mg | ORAL_TABLET | Freq: Two times a day (BID) | ORAL | 0 refills | Status: AC

## 2023-10-04 NOTE — Discharge Instructions (Signed)
 It appears you have a urinary tract infection.  Your symptoms may be secondary to infection.  Do not take Macrobid, that was prescribed to you.Please take the ciprofloxacin we are prescribing as it has broader coverage.  You may also take over-the-counter Tylenol alternating with ibuprofen for pain.  We are also prescribing you lidocaine patches and muscle relaxers for pain.  Return immediately if develop fevers, chills, chest pain, difficulty breathing, lightheadedness, passout, inability to take your antibiotics due to nausea and vomiting, severe abdominal pain or you may return if develop any new or worsening symptoms that are concerning to you.

## 2023-10-04 NOTE — ED Notes (Signed)
 Pt ambulated to and from BR w/o need of assistance.

## 2023-10-04 NOTE — ED Triage Notes (Addendum)
 Wrong chart

## 2023-10-04 NOTE — ED Provider Notes (Signed)
 [facility]    CSN: [id number] Arrival date & time: 10/04/23  0801      History   Chief Complaint Chief Complaint  Patient presents with   Pain    HPI Mary Silva is a 53 y.o. female.   Patient presents today for evaluation of right lateral chest pain that started a few days ago and worsened last night.  She reports that pain is now constant and is a stabbing pain.  She notes pain is worsened with movement but also with deep breathing.  She does not report any pain elsewhere and has not had any injury that she is aware of.  She notes symptoms started as she was walking from her car into work.  She denies any nausea or vomiting.  The history is provided by the patient.    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/11/2018   Disc degeneration, lumbar 10/21/2018   Lumbar radiculopathy 10/21/2018   Failed orthopedic implant (HCC) 09/13/2018   History of ankle surgery 10/08/2017   Acquired hypothyroidism 07/07/2017   Bipolar 2 disorder (HCC) 07/07/2017   Arthritis of left knee 12/02/2016   Hyperglycemia 03/06/2016   Family history of premature CAD 02/20/2016   Abnormal EKG 01/17/2016   Former smoker 01/17/2016   Chest pain in adult 01/16/2016   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      Home Medications    Prior to Admission medications   Medication Sig Start Date End Date Taking? Authorizing Provider  acetic acid-hydrocortisone (VOSOL-HC) OTIC solution Place 3 drops into both ears 3 (three) times daily. 04/20/23  Yes [provider]  Blood Glucose Monitoring Suppl (ONETOUCH VERIO FLEX SYSTEM) w/Device KIT THIS IS FOR BLOOD GLUCOSE MONITORING WHICHEVER VERSION IS COVERED BY THEIR INSURANCE. 09/19/22  Yes [provider]  buPROPion (WELLBUTRIN XL) 300 MG 24 hr tablet Take by mouth. 02/04/22  Yes [provider]  doxycycline (PERIOSTAT) 20 MG tablet  02/02/22  Yes [provider]  fexofenadine (ALLEGRA) 180 MG tablet Take by mouth. 10/29/15   [provider]  FLUoxetine (PROZAC) 20 MG capsule Take 1 tablet by mouth daily. 09/18/22  Yes [provider]  fluticasone (FLONASE) 50 MCG/ACT nasal spray Place into the nose. 07/26/15   [provider]  Fluticasone-Umeclidin-Vilant (TRELEGY ELLIPTA) 100-62.5-25 MCG/ACT AEPB Inhale into the lungs. 09/30/21  Yes [provider]  glucose blood (ONETOUCH VERIO) test strip daily. 09/18/22  Yes [provider]  glucose blood test strip 1 each by Other  route as needed. 09/18/22  Yes [provider]  levothyroxine (SYNTHROID) 88 MCG tablet Take 1 tablet by mouth daily. 03/23/22  Yes [provider]  metFORMIN (GLUCOPHAGE) 500 MG tablet Take 1,000 mg by mouth as directed. 500mg  am and 1000mg  QHS 09/18/22  Yes [provider]  nitrofurantoin, macrocrystal-monohydrate, (MACROBID) 100 MG capsule Take 100 mg by mouth 2 (two) times daily. 10/03/23  Yes [provider]  orphenadrine (NORFLEX) 100 MG tablet Take by mouth. 06/18/22  Yes [provider]  rosuvastatin (CRESTOR) 5 MG tablet Take 1 tablet by mouth every other day. 09/18/22  Yes [provider]  topiramate (TOPAMAX) 100 MG tablet Take by mouth. 12/12/15  Yes [provider]  triamcinolone (NASACORT) 55 MCG/ACT AERO nasal inhaler Place 1 spray into the nose daily. 04/20/23  Yes [provider]  albuterol (PROVENTIL HFA;VENTOLIN HFA) 108 (90 Base) MCG/ACT inhaler Inhale 1 puff into the lungs every 6 (six) hours as needed for wheezing or shortness of breath.    [provider]  amphetamine-dextroamphetamine (ADDERALL) 20 MG tablet Take 20 mg by mouth daily.   Yes [provider]  celecoxib (CELEBREX) 200 MG capsule Take 200 mg by mouth daily.   Yes [provider]  Cholecalciferol 125 MCG (5000 UT) TABS Take 5,000 Units by mouth daily.   Yes [provider]  diphenhydrAMINE (BENADRYL) 25 mg capsule Take 1 capsule (25 mg total) by mouth every 6 (six) hours as needed for itching. 10/31/22   Derwood Kaplan, MD  EPINEPHrine 0.3 mg/0.3 mL IJ SOAJ injection Inject 0.3 mg into the muscle as needed for anaphylaxis. 10/31/22   Derwood Kaplan, MD  ibuprofen (ADVIL) 600 MG tablet Take 1 tablet (600 mg total) by mouth every 6 (six) hours as needed for moderate pain. 05/22/19   Lurene Shadow, PA-C  lamoTRIgine (LAMICTAL) 150 MG tablet Take 150 mg by mouth daily.    [provider]  levothyroxine (SYNTHROID) 75 MCG tablet Take 75 mcg by mouth daily before breakfast.    [provider]  mometasone-formoterol (DULERA) 100-5 MCG/ACT AERO Inhale 2 puffs into the lungs 2 (two) times daily.    [provider]  naproxen (NAPROSYN) 500 MG tablet Take 1 tablet (500 mg total) by mouth 2 (two) times daily with a meal. 12/23/15   Pricilla Loveless, MD  omeprazole (PRILOSEC) 10 MG capsule Take 10 mg by mouth daily.    [provider]  omeprazole (PRILOSEC) 40 MG capsule Take by mouth.    [provider]  orphenadrine (NORFLEX) 100 MG tablet Take 100 mg by mouth 2 (two) times daily.   Yes [provider]  predniSONE (DELTASONE) 10 MG tablet Take 5 tablets (50 mg total) by mouth daily. 10/31/22   Derwood Kaplan, MD  pregabalin (LYRICA) 75 MG capsule Take 75 mg by mouth 2 (two) times daily.    [provider]  traMADol (ULTRAM) 50 MG tablet Take 1 tablet (50 mg total) by mouth every 6 (six) hours as needed for moderate pain or severe pain. 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  Eyes:  Negative for discharge and redness.  Respiratory:  Negative for shortness of breath.   Cardiovascular:  Positive for chest pain.  Gastrointestinal:  Negative for abdominal pain, nausea and vomiting.     Physical Exam Triage Vital Signs ED Triage Vitals  Encounter Vitals Group     BP      Systolic BP Percentile      Diastolic BP Percentile      Pulse      Resp      Temp      Temp src      SpO2      Weight      Height      Head Circumference      Peak Flow      Pain Score      Pain Loc      Pain Education      Exclude from Growth Chart    No data  found.  Updated Vital Signs BP 117/84 (BP Location: Left Arm)   Pulse 97   Temp 98.5 F (36.9 C) (Oral)   Resp 20   Ht 5\' 2"  (1.575 m)   Wt 190 lb (86.2 kg)   LMP 05/19/2019 (Exact Date)   SpO2 93%   BMI 34.75 kg/m   Visual Acuity Right Eye Distance:   Left Eye Distance:   Bilateral Distance:    Right Eye Near:   Left Eye Near:    Bilateral Near:     Physical Exam Vitals and nursing note reviewed.  Constitutional:      Appearance: Normal appearance. She is not ill-appearing.     Comments: Appears uncomfortable  HENT:     Head: Normocephalic and atraumatic.  Eyes:     Conjunctiva/sclera: Conjunctivae normal.  Cardiovascular:     Rate and Rhythm: Normal rate and regular rhythm.  Pulmonary:     Breath sounds: No wheezing, rhonchi or rales.     Comments: Patient has difficulty taking deep breaths due to pain Chest:     Chest wall: No tenderness.  Abdominal:     General: Abdomen is flat. Bowel sounds are normal. There is no distension.     Palpations: Abdomen is soft.     Tenderness: There is no abdominal tenderness. There is no guarding or rebound.  Neurological:     Mental Status: She is alert.  Psychiatric:        Mood and Affect: Mood normal.        Behavior: Behavior normal.        Thought Content: Thought content normal.      UC Treatments / Results  Labs (all labs ordered are listed, but only abnormal results are displayed) Labs Reviewed - No data to display  EKG   Radiology No results found.  Procedures Procedures (including critical care time)  Medications Ordered in UC Medications - No data to display  Initial Impression / Assessment and Plan / UC Course  I have reviewed the triage vital signs and the nursing notes.  Pertinent labs & imaging results that were available during my care of the patient were reviewed by me and considered in my medical decision making (see chart for details).    EKG without acute findings.  Possibility of  pulled muscle or other inflammatory/ musculoskeletal cause however given pain is not reproducible on exam with palpation recommended further evaluation in the emergency room for imaging and further workup.  Patient is agreeable to same and will transport via POV.  Final Clinical Impressions(s) / UC Diagnoses   Final diagnoses:  Right-sided chest pain   Discharge Instructions   None    ED Prescriptions   None    PDMP not reviewed this encounter.   Tylisa, Alcivar, PA-C 10/04/23 [phone]

## 2023-10-04 NOTE — ED Notes (Signed)
 Patient is being discharged from the Urgent Care and sent to the Emergency Department via Private Vehicle (Self) . Per Provider, patient is in need of higher level of care due to Chest Pain. Patient is aware and verbalizes understanding of plan of care.  Vitals:   10/04/23 0828  BP: 117/84  Pulse: 97  Resp: 20  Temp: 98.5 F (36.9 C)  SpO2: 93%

## 2023-10-04 NOTE — ED Triage Notes (Signed)
 Patient sent from urgent care due to rib cage pain. This began on Friday. Felt like she pulled a muscle. Feels like someone is sticking a knife in between her ribs on the right back area. No falls.

## 2023-10-04 NOTE — ED Provider Notes (Signed)
 [place] EMERGENCY DEPARTMENT AT [facility] Provider Note   CSN: [id number] Arrival date & time: 10/04/23  2130     History  Chief Complaint  Patient presents with   Rib Cage Pain    Mary Silva is a 53 y.o. female.  This is a 53 year old female presenting emergency department for right sided chest pain.  Symptoms ongoing for the past several days.  No inciting event that she can recall.  Gradually worsened.  No associated shortness of breath.  Described as sharp.  Worsened with movement and deep inspiration.  No associated shortness of breath, nausea.  No abdominal pain.  Does note frequency.  Went to urgent care and was told to come to the emergency department for evaluation.        Home Medications Prior to Admission medications   Medication Sig Start Date End Date Taking? Authorizing Provider  ciprofloxacin (CIPRO) 500 MG tablet Take 1 tablet (500 mg total) by mouth every 12 (twelve) hours for 7 days. 10/04/23 10/11/23 Yes Auburn Hert, Feliz Beam J, DO  lidocaine 4 % Place 1 patch onto the skin daily for 10 days. 10/04/23 10/14/23 Yes Clara Smolen, Harmon Dun, DO  methocarbamol (ROBAXIN) 500 MG tablet Take 1 tablet (500 mg total) by mouth 2 (two) times daily for 5 days. 10/04/23 10/09/23 Yes Sotero Brinkmeyer, Harmon Dun, DO  acetic acid-hydrocortisone (VOSOL-HC) OTIC solution Place 3 drops into both ears 3 (three) times daily. 04/20/23   [provider]  albuterol (PROVENTIL HFA;VENTOLIN HFA) 108 (90 Base) MCG/ACT inhaler Inhale 1 puff into the lungs every 6 (six) hours as needed for wheezing or shortness of breath.    [provider]  amphetamine-dextroamphetamine (ADDERALL) 20 MG tablet Take 20 mg by mouth daily.    [provider]  Blood Glucose Monitoring Suppl (ONETOUCH VERIO FLEX SYSTEM) w/Device KIT THIS IS FOR BLOOD GLUCOSE MONITORING WHICHEVER VERSION IS COVERED BY THEIR INSURANCE. 09/19/22   [provider]  buPROPion (WELLBUTRIN XL) 300 MG 24 hr tablet Take  by mouth. 02/04/22   [provider]  celecoxib (CELEBREX) 200 MG capsule Take 200 mg by mouth daily.    [provider]  Cholecalciferol 125 MCG (5000 UT) TABS Take 5,000 Units by mouth daily.    [provider]  diphenhydrAMINE (BENADRYL) 25 mg capsule Take 1 capsule (25 mg total) by mouth every 6 (six) hours as needed for itching. 10/31/22   Derwood Kaplan, MD  doxycycline (PERIOSTAT) 20 MG tablet  02/02/22   [provider]  EPINEPHrine 0.3 mg/0.3 mL IJ SOAJ injection Inject 0.3 mg into the muscle as needed for anaphylaxis. 10/31/22   Derwood Kaplan, MD  fexofenadine (ALLEGRA) 180 MG tablet Take by mouth. 10/29/15   [provider]  FLUoxetine (PROZAC) 20 MG capsule Take 1 tablet by mouth daily. 09/18/22   [provider]  fluticasone (FLONASE) 50 MCG/ACT nasal spray Place into the nose. 07/26/15   [provider]  Fluticasone-Umeclidin-Vilant (TRELEGY ELLIPTA) 100-62.5-25 MCG/ACT AEPB Inhale into the lungs. 09/30/21   [provider]  glucose blood (ONETOUCH VERIO) test strip daily. 09/18/22   [provider]  glucose blood test strip 1 each by Other route as needed. 09/18/22   [provider]  ibuprofen (ADVIL) 600 MG tablet Take 1 tablet (600 mg total) by mouth every 6 (six) hours as needed for moderate pain. 05/22/19   Lurene Shadow, PA-C  lamoTRIgine (LAMICTAL) 150 MG tablet Take 150 mg by mouth daily.    [provider]  levothyroxine (SYNTHROID) 75 MCG tablet Take 75 mcg by mouth daily before breakfast.    [provider]  levothyroxine (SYNTHROID) 88 MCG tablet Take 1 tablet by mouth daily. 03/23/22   [provider]  metFORMIN (GLUCOPHAGE) 500 MG tablet Take 1,000 mg by mouth as directed. 500mg  am and 1000mg  QHS 09/18/22   [provider]  mometasone-formoterol (DULERA) 100-5 MCG/ACT AERO Inhale 2 puffs into the lungs 2 (two) times daily.    [provider]  naproxen  (NAPROSYN) 500 MG tablet Take 1 tablet (500 mg total) by mouth 2 (two) times daily with a meal. 12/23/15   Pricilla Loveless, MD  omeprazole (PRILOSEC) 10 MG capsule Take 10 mg by mouth daily.    [provider]  omeprazole (PRILOSEC) 40 MG capsule Take by mouth.    [provider]  orphenadrine (NORFLEX) 100 MG tablet Take 100 mg by mouth 2 (two) times daily.    [provider]  orphenadrine (NORFLEX) 100 MG tablet Take by mouth. 06/18/22   [provider]  predniSONE (DELTASONE) 10 MG tablet Take 5 tablets (50 mg total) by mouth daily. 10/31/22   Derwood Kaplan, MD  pregabalin (LYRICA) 75 MG capsule Take 75 mg by mouth 2 (two) times daily.    [provider]  rosuvastatin (CRESTOR) 5 MG tablet Take 1 tablet by mouth every other day. 09/18/22   [provider]  topiramate (TOPAMAX) 100 MG tablet Take by mouth. 12/12/15   [provider]  traMADol (ULTRAM) 50 MG tablet Take 1 tablet (50 mg total) by mouth every 6 (six) hours as needed for moderate pain or severe pain. 05/22/19   Lurene Shadow, PA-C  triamcinolone (NASACORT) 55 MCG/ACT AERO nasal inhaler Place 1 spray into the nose daily. 04/20/23   [provider]      Allergies    Oxycodone, Terbinafine, Terbinafine hcl, Sulfamethoxazole-trimethoprim, Bactrim ds [sulfamethoxazole-trimethoprim], and Terbinafine hcl    Review of Systems   Review of Systems  Physical Exam Updated Vital Signs BP 134/73   Pulse 88   Temp 98.1 F (36.7 C)   Resp 12   Ht 5\' 2"  (1.575 m)   Wt 86.2 kg   LMP 05/19/2019 (Exact Date)   SpO2 95%   BMI 34.75 kg/m  Physical Exam Vitals and nursing note reviewed.  Constitutional:      General: She is not in acute distress.    Appearance: She is not toxic-appearing.  HENT:     Head: Normocephalic.     Nose: Nose normal.  Eyes:     Conjunctiva/sclera: Conjunctivae normal.  Cardiovascular:     Rate and Rhythm: Normal rate and regular rhythm.   Pulmonary:     Effort: Pulmonary effort is normal.     Breath sounds: Normal breath sounds.  Abdominal:     General: Abdomen is flat. There is no distension.     Palpations: Abdomen is soft.     Tenderness: There is no abdominal tenderness. There is no guarding or rebound.  Musculoskeletal:     Comments: Patient with some tenderness along the posterior aspect of the costovertebral angle.  No CVA tenderness.  Skin:    General: Skin is warm and dry.     Capillary Refill: Capillary refill takes less than 2 seconds.  Neurological:     Mental Status: She is alert and oriented to person, place, and time.  Psychiatric:        Mood and Affect: Mood  normal.        Behavior: Behavior normal.     ED Results / Procedures / Treatments   Labs (all labs ordered are listed, but only abnormal results are displayed) Labs Reviewed  CBC - Abnormal; Notable for the following components:      Result Value   WBC 11.6 (*)    All other components within normal limits  URINALYSIS, ROUTINE W REFLEX MICROSCOPIC - Abnormal; Notable for the following components:   Specific Gravity, Urine >1.030 (*)    Glucose, UA 100 (*)    Protein, ur 30 (*)    Nitrite POSITIVE (*)    All other components within normal limits  URINALYSIS, MICROSCOPIC (REFLEX) - Abnormal; Notable for the following components:   Bacteria, UA RARE (*)    All other components within normal limits  COMPREHENSIVE METABOLIC PANEL WITH GFR  TROPONIN I (HIGH SENSITIVITY)    EKG EKG Interpretation Date/Time:  Monday October 04 2023 09:56:57 EDT Ventricular Rate:  88 PR Interval:  138 QRS Duration:  106 QT Interval:  390 QTC Calculation: 472 R Axis:   60  Text Interpretation: Sinus rhythm Confirmed by Estanislado Pandy [phone]) on 10/04/2023 10:58:40 AM  Radiology DG Chest 2 View Result Date: 10/04/2023 CLINICAL DATA:  Right-sided chest pain EXAM: CHEST - 2 VIEW COMPARISON:  X-ray 12/23/2015 FINDINGS: No consolidation, pneumothorax or  effusion. No edema. Normal cardiopericardial silhouette. Sternal wires. Mild degenerative changes of the spine. IMPRESSION: No acute cardiopulmonary disease. Electronically Signed   By: Karen Kays M.D.   On: 10/04/2023 10:59    Procedures Procedures    Medications Ordered in ED Medications  ketorolac (TORADOL) 15 MG/ML injection 15 mg (15 mg Intravenous Given 10/04/23 1023)  acetaminophen (TYLENOL) tablet 650 mg (650 mg Oral Given 10/04/23 1022)  methocarbamol (ROBAXIN) tablet 500 mg (500 mg Oral Given 10/04/23 1022)    ED Course/ Medical Decision Making/ A&P Clinical Course as of 10/04/23 1712  Mon Oct 04, 2023  0940 CTA chest in 2017 per chart review: 'IMPRESSION: No acute intrathoracic, abdominal, or pelvic pathology. Specifically there is no aortic dissection, or aneurysm.   No CT evidence of central pulmonary artery embolus.   Fatty liver.   Diverticulosis.   " [TY]  1711 Workup with negative troponin x 2.  EKG without ST segment changes to indicate ischemia.  No metabolic derangements.  No transaminitis that was a chest hepatobiliary process.  Does have mild leukocytosis, but also appears to have UTI.  Given location of symptoms possible Pilo?Marland Kitchen  She is tolerating p.o.  Treated multimodal pain medications.  Will discharge with antibiotics.  Return precautions given. [TY]    Clinical Course User Index [TY] Coral Spikes, DO                                 Medical Decision Making Is a 53 year old female present emergency department with right-sided chest pain.  She is afebrile nontachycardic maintaining oxygen saturation on room air.  Does not appear to be in obvious distress.  Pain seemingly reproducible with palpation which would suggest MSK etiology.  However, does have risk factors for cardiac etiology.  Will get EKG, chest x-ray and troponin.  Does note some dysuria, will get UA and basic screening labs.  Will treat with multimodal pain medications awaiting workup.  Low risk  for PE based on Wells criteria.  See ED course for final MDM/disposition.  Amount and/or  Complexity of Data Reviewed Labs: ordered. Radiology: ordered. ECG/medicine tests: ordered.  Risk OTC drugs. Prescription drug management.         Final Clinical Impression(s) / ED Diagnoses Final diagnoses:  Acute UTI    Rx / DC Orders ED Discharge Orders          Ordered    lidocaine 4 %  Every 24 hours        10/04/23 1148    methocarbamol (ROBAXIN) 500 MG tablet  2 times daily        10/04/23 1148    ciprofloxacin (CIPRO) 500 MG tablet  Every 12 hours        10/04/23 1148              Saba Neuman, Harmon Dun, DO 10/04/23 1712

## 2023-10-04 NOTE — ED Triage Notes (Signed)
"  On Friday I walked from my car to my work and started with right upper rib pain, this was on/off and continues to be really bad to a stabbing pain now, still upper right side of ribs". No pain anywhere else. No injury known.

## 2023-10-04 NOTE — ED Notes (Signed)
 Patient transported to X-ray
